# Patient Record
Sex: Male | Born: 1965 | Race: Black or African American | Hispanic: No | Marital: Married | Smoking: Never smoker
Health system: Southern US, Community
[De-identification: ages and names within clinical notes are randomized; demographics above are authoritative.]

## PROBLEM LIST (undated history)

## (undated) DIAGNOSIS — I10 Essential (primary) hypertension: Secondary | ICD-10-CM

## (undated) DIAGNOSIS — E119 Type 2 diabetes mellitus without complications: Secondary | ICD-10-CM

## (undated) DIAGNOSIS — E785 Hyperlipidemia, unspecified: Secondary | ICD-10-CM

## (undated) DIAGNOSIS — F5101 Primary insomnia: Secondary | ICD-10-CM

## (undated) DIAGNOSIS — Z8042 Family history of malignant neoplasm of prostate: Secondary | ICD-10-CM

## (undated) DIAGNOSIS — E349 Endocrine disorder, unspecified: Secondary | ICD-10-CM

## (undated) HISTORY — PX: CYST REMOVAL HAND: SHX6279

## (undated) HISTORY — DX: Essential (primary) hypertension: I10

## (undated) HISTORY — DX: Hyperlipidemia, unspecified: E78.5

## (undated) HISTORY — DX: Type 2 diabetes mellitus without complications: E11.9

## (undated) HISTORY — PX: OTHER SURGICAL HISTORY: SHX169

## (undated) HISTORY — DX: Endocrine disorder, unspecified: E34.9

---
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
## 2005-04-03 ENCOUNTER — Ambulatory Visit: Payer: Self-pay | Admitting: Family Medicine

## 2006-01-10 ENCOUNTER — Ambulatory Visit: Payer: Self-pay | Admitting: Family Medicine

## 2007-01-28 ENCOUNTER — Ambulatory Visit: Payer: Self-pay | Admitting: Family Medicine

## 2007-04-08 ENCOUNTER — Ambulatory Visit: Payer: Self-pay | Admitting: Family Medicine

## 2014-05-11 DIAGNOSIS — I10 Essential (primary) hypertension: Secondary | ICD-10-CM | POA: Insufficient documentation

## 2014-05-11 DIAGNOSIS — E291 Testicular hypofunction: Secondary | ICD-10-CM | POA: Insufficient documentation

## 2014-05-11 DIAGNOSIS — N529 Male erectile dysfunction, unspecified: Secondary | ICD-10-CM | POA: Insufficient documentation

## 2015-08-23 ENCOUNTER — Ambulatory Visit (INDEPENDENT_AMBULATORY_CARE_PROVIDER_SITE_OTHER): Payer: Worker's Compensation | Admitting: Pediatrics

## 2015-08-23 VITALS — BP 156/111 | HR 88 | Ht 70.0 in | Wt 225.8 lb

## 2015-08-23 DIAGNOSIS — S60940A Unspecified superficial injury of right index finger, initial encounter: Secondary | ICD-10-CM | POA: Diagnosis not present

## 2015-08-23 DIAGNOSIS — Z23 Encounter for immunization: Secondary | ICD-10-CM | POA: Diagnosis not present

## 2015-08-23 DIAGNOSIS — IMO0002 Reserved for concepts with insufficient information to code with codable children: Secondary | ICD-10-CM

## 2015-08-23 NOTE — Progress Notes (Signed)
    Subjective:    Patient ID: Julian Scott, male    DOB: 1966-05-15, 49 y.o.   MRN: [id number]  HPI: Julian CootsSpencer B Garrow is a 49 y.o. male presenting on 08/23/2015 for Laceration  He grabbed a doorknob at work and it broke in his hand. The metal portion cut his R index finger and lateral side of palm. This happened earlier today.  ROS: Per HPI unless specifically indicated above     Objective:    BP 156/111 mmHg  Pulse 88  Ht 5\' 10"  (1.778 m)  Wt 225 lb 12.8 oz (102.422 kg)  BMI 32.40 kg/m2  Wt Readings from Last 3 Encounters:  08/23/15 225 lb 12.8 oz (102.422 kg)    Gen: NAD, alert, cooperative with exam, NCAT EYES: EOMI, no scleral injection or icterus CV: WWP, radial pulses 2+ b/l.  Resp:  normal WOB Skin: sub 1mm deep apprx 8 mm in length laceration across palmar aspect of proximal 2nd finger with minimal bleeding. Another <71mm to 1mm deep laceration of apprx 7mm with very small skin flap curved in "C" shape on palm. Minimal bleeding. Skin is already well-approximated, lacerations not all the way through dermis.  Neuro: Alert and oriented, sensation intact b/l UE MSK: normal muscle bulk     Assessment & Plan:   Julian Scott was seen today for laceration. Skin is well-approximated, bleeding controlle,d no foreign bodies visible. Washed and cleaned lacerations with alcohol. Discussed with pt, stiches not needed at this time. Covered with antibiotic ointment. Wash with soap and water, keep clean, covered at work. Received tetanus shot as below. Will let us if worsens.  Diagnoses and all orders for this visit:  Laceration  Other orders -     Td : Tetanus/diphtheria >7yo Preservative  free    Follow up plan: Return if symptoms worsen or fail to improve.  Julian Krasarol Vincent, MD [facility] 08/23/2015, 4:24 PM

## 2015-08-23 NOTE — Patient Instructions (Addendum)
Bacitracin or similar triple antibioitc ointment  Wash with soap and water twice a day

## 2015-08-25 ENCOUNTER — Telehealth: Payer: Self-pay | Admitting: Pediatrics

## 2015-08-25 NOTE — Telephone Encounter (Signed)
Copied note and gave to Tonya/lc

## 2015-12-16 DIAGNOSIS — Z8042 Family history of malignant neoplasm of prostate: Secondary | ICD-10-CM

## 2015-12-16 HISTORY — DX: Family history of malignant neoplasm of prostate: Z80.42

## 2017-02-01 DIAGNOSIS — F5101 Primary insomnia: Secondary | ICD-10-CM | POA: Insufficient documentation

## 2017-02-01 DIAGNOSIS — E1169 Type 2 diabetes mellitus with other specified complication: Secondary | ICD-10-CM | POA: Insufficient documentation

## 2017-02-01 HISTORY — DX: Primary insomnia: F51.01

## 2019-05-05 ENCOUNTER — Ambulatory Visit: Payer: Commercial Managed Care - PPO | Admitting: Family Medicine

## 2019-05-05 ENCOUNTER — Encounter: Payer: Self-pay | Admitting: Family Medicine

## 2019-05-05 ENCOUNTER — Other Ambulatory Visit: Payer: Self-pay

## 2019-05-05 VITALS — BP 172/116 | HR 89 | Temp 96.8°F | Ht 70.0 in | Wt 217.0 lb

## 2019-05-05 DIAGNOSIS — E291 Testicular hypofunction: Secondary | ICD-10-CM

## 2019-05-05 DIAGNOSIS — E785 Hyperlipidemia, unspecified: Secondary | ICD-10-CM

## 2019-05-05 DIAGNOSIS — Z79899 Other long term (current) drug therapy: Secondary | ICD-10-CM

## 2019-05-05 DIAGNOSIS — N529 Male erectile dysfunction, unspecified: Secondary | ICD-10-CM

## 2019-05-05 DIAGNOSIS — F5101 Primary insomnia: Secondary | ICD-10-CM | POA: Diagnosis not present

## 2019-05-05 DIAGNOSIS — I10 Essential (primary) hypertension: Secondary | ICD-10-CM | POA: Diagnosis not present

## 2019-05-05 DIAGNOSIS — E1169 Type 2 diabetes mellitus with other specified complication: Secondary | ICD-10-CM

## 2019-05-05 DIAGNOSIS — Z7689 Persons encountering health services in other specified circumstances: Secondary | ICD-10-CM

## 2019-05-05 MED ORDER — ZOLPIDEM TARTRATE 10 MG PO TABS: 10.0000 mg | ORAL_TABLET | Freq: Every evening | ORAL | 5 refills | Status: DC | PRN

## 2019-05-05 MED ORDER — ATORVASTATIN CALCIUM 40 MG PO TABS: 40.0000 mg | ORAL_TABLET | Freq: Every day | ORAL | 1 refills | Status: DC

## 2019-05-05 MED ORDER — GLIPIZIDE ER 5 MG PO TB24: 5.0000 mg | ORAL_TABLET | Freq: Every day | ORAL | 1 refills | Status: DC

## 2019-05-05 MED ORDER — TESTOSTERONE CYPIONATE 200 MG/ML IM SOLN: 200.0000 mg | INTRAMUSCULAR | 2 refills | Status: DC

## 2019-05-05 MED ORDER — AMLODIPINE BESYLATE 10 MG PO TABS: 10.0000 mg | ORAL_TABLET | Freq: Every day | ORAL | 1 refills | Status: DC

## 2019-05-05 MED ORDER — TAMSULOSIN HCL 0.4 MG PO CAPS: 0.4000 mg | ORAL_CAPSULE | Freq: Every day | ORAL | 1 refills | Status: DC

## 2019-05-05 MED ORDER — LOSARTAN POTASSIUM-HCTZ 100-25 MG PO TABS: 1.0000 | ORAL_TABLET | Freq: Every day | ORAL | 1 refills | Status: DC

## 2019-05-05 MED ORDER — SILDENAFIL CITRATE 100 MG PO TABS: 50.0000 mg | ORAL_TABLET | Freq: Every day | ORAL | 0 refills | Status: DC | PRN

## 2019-05-05 NOTE — Progress Notes (Signed)
New Patient Office Visit  Assessment & Plan:  1. Uncontrolled hypertension - Patient has not taken BP medications in a few days. He admits he does not like taking medication and sometimes forgets. Education provided on the DASH diet. Encouraged to continue exercising and work up to 30 minutes 5 days a week.  - amLODipine (NORVASC) 10 MG tablet; Take 1 tablet (10 mg total) by mouth daily.  Dispense: 90 tablet; Refill: 1 - losartan-hydrochlorothiazide (HYZAAR) 100-25 MG tablet; Take 1 tablet by mouth daily.  Dispense: 90 tablet; Refill: 1  2. Erectile dysfunction, unspecified erectile dysfunction type - sildenafil (VIAGRA) 100 MG tablet; Take 0.5-1 tablets (50-100 mg total) by mouth daily as needed for erectile dysfunction.  Dispense: 30 tablet; Refill: 0  3. Primary insomnia - Controled substance agreement signed today for Ambien 10 mg QHS PRN. Education provided on insomnia.  - ToxASSURE Select 13 (MW), Urine - zolpidem (AMBIEN) 10 MG tablet; Take 1 tablet (10 mg total) by mouth at bedtime as needed. for sleep  Dispense: 30 tablet; Refill: 5  4. Controlled substance agreement signed - Controled substance agreement signed today for Ambien 10 mg QHS PRN - ToxASSURE Select 13 (MW), Urine  5. DM type 2 with diabetic dyslipidemia (HCC) - Last A1c 7.2 with no medication changes made by previous PCP. Patient does not like to take medication. He has recently started exercising; encouraged him to increase to 30 minutes 5 days a week. Education provided on a diabetic diet. If A1c still > 7.0 at his next visit we will increase glipizide to 10 mg QD from 5 mg QD.   6. Hypogonadism in male - Managed with injections Q14 days by his wife.  - testosterone cypionate (DEPOTESTOSTERONE CYPIONATE) 200 MG/ML injection; Inject 1 mL (200 mg total) into the muscle every 14 (fourteen) days.  Dispense: 2 mL; Refill: 2  7. Encounter to establish care   Follow-up: Return in about 2 months (around 07/06/2019)  for DM, ED.   Deliah BostonBritney , MSN, APRN, FNP-C [facility]  Subjective:  Patient ID: Julian Scott, male    DOB: 1966/01/13  Age: 53 y.o. MRN: [id number]  CC:  Chief Complaint  Patient presents with  . Establish Care    HPI Julian CootsSpencer B Scott presents to establish care. He is transferring care from Dr.  CopaNyland's office as he has retired and the office has closed.   Hypogonadism: Patient's wife gives him the testosterone injections.  Hypertension: He has not had his BP medication this weekend but did take it this morning about an hour and a half ago. He started walking this weekend for exercise. He does not check his BP at home. Does limit salt.   Insomnia: Patient takes Ambien nightly to help with sleep.    Review of Systems  Constitutional: Negative for chills, fever, malaise/fatigue and weight loss.  HENT: Negative for congestion, ear discharge, ear pain, nosebleeds, sinus pain, sore throat and tinnitus.   Eyes: Negative for blurred vision, double vision, pain, discharge and redness.  Respiratory: Negative for cough, shortness of breath and wheezing.   Cardiovascular: Negative for chest pain, palpitations and leg swelling.  Gastrointestinal: Negative for abdominal pain, constipation, diarrhea, heartburn, nausea and vomiting.  Genitourinary: Negative for dysuria, frequency and urgency.  Musculoskeletal: Negative for myalgias.  Skin: Negative for rash.  Neurological: Negative for dizziness, seizures, weakness and headaches.  Psychiatric/Behavioral: Negative for depression, substance abuse and suicidal ideas. The patient has insomnia. The patient is not nervous/anxious.  Current Outpatient Medications:  .  amLODipine (NORVASC) 10 MG tablet, TAKE 1 TABLET BY MOUTH DAILY, Disp: , Rfl:  .  atorvastatin (LIPITOR) 40 MG tablet, Take 40 mg by mouth daily. , Disp: , Rfl:  .  glipiZIDE (GLUCOTROL XL) 5 MG 24 hr tablet, Take 5 mg by mouth daily with  breakfast. , Disp: , Rfl:  .  losartan-hydrochlorothiazide (HYZAAR) 100-25 MG tablet, Take 1 tablet by mouth daily. , Disp: , Rfl:  .  tamsulosin (FLOMAX) 0.4 MG CAPS capsule, TAKE ONE CAPSULE (0.4 MG DOSE) BY MOUTH AT BEDTIME. FOR PROSTATE ENLARGEMENT, Disp: , Rfl:  .  testosterone cypionate (DEPOTESTOSTERONE CYPIONATE) 200 MG/ML injection, INJECT 1ML INTRAMUSCULARLY EVERY 14 DAYS, Disp: , Rfl:  .  zolpidem (AMBIEN) 10 MG tablet, Take 10 mg by mouth at bedtime as needed. for sleep, Disp: , Rfl:  .  sildenafil (VIAGRA) 100 MG tablet, Take 0.5-1 tablets (50-100 mg total) by mouth daily as needed for erectile dysfunction., Disp: 30 tablet, Rfl: 0  No Known Allergies  Past Medical History:  Diagnosis Date  . Diabetes mellitus ([place])   . Hyperlipidemia   . Hypertension   . Testosterone deficiency     Past Surgical History:  Procedure Laterality Date  . Cyst Removal Back    . CYST REMOVAL HAND      Family History  Problem Relation Age of Onset  . Hypertension Mother   . Diabetes Mother   . Parkinson's disease Father   . Dementia Father   . Hypertension Brother   . Diabetes Maternal Grandmother   . Heart attack Maternal Grandfather   . Dementia Paternal Grandmother   . Lung disease Paternal Grandfather     Social History   Socioeconomic History  . Marital status: Married    Spouse name: Not on file  . Number of children: Not on file  . Years of education: Not on file  . Highest education level: Not on file  Occupational History  . Not on file  Social Needs  . Financial resource strain: Not on file  . Food insecurity    Worry: Not on file    Inability: Not on file  . Transportation needs    Medical: Not on file    Non-medical: Not on file  Tobacco Use  . Smoking status: Never Smoker  . Smokeless tobacco: Never Used  Substance and Sexual Activity  . Alcohol use: Not Currently    Comment: Every blue moon - socially  . Drug use: Never  . Sexual activity: Not on file   Lifestyle  . Physical activity    Days per week: Not on file    Minutes per session: Not on file  . Stress: Not on file  Relationships  . Social Herbalist on phone: Not on file    Gets together: Not on file    Attends religious service: Not on file    Active member of club or organization: Not on file    Attends meetings of clubs or organizations: Not on file    Relationship status: Not on file  . Intimate partner violence    Fear of current or ex partner: Not on file    Emotionally abused: Not on file    Physically abused: Not on file    Forced sexual activity: Not on file  Other Topics Concern  . Not on file  Social History Narrative  . Not on file    Objective:   Today's  Vitals: BP (!) 172/116 Comment: manual  Pulse 89   Temp (!) 96.8 F (36 C) (Oral)   Ht 5\' 10"  (1.778 m)   Wt 217 lb (98.4 kg)   BMI 31.14 kg/m   Physical Exam Vitals signs reviewed.  Constitutional:      General: He is not in acute distress.    Appearance: Normal appearance. He is overweight. He is not ill-appearing, toxic-appearing or diaphoretic.  HENT:     Head: Normocephalic and atraumatic.  Eyes:     General: No scleral icterus.       Right eye: No discharge.        Left eye: No discharge.     Conjunctiva/sclera: Conjunctivae normal.  Neck:     Musculoskeletal: Normal range of motion.  Cardiovascular:     Rate and Rhythm: Normal rate and regular rhythm.     Heart sounds: Normal heart sounds. No murmur. No friction rub. No gallop.   Pulmonary:     Effort: Pulmonary effort is normal. No respiratory distress.     Breath sounds: Normal breath sounds. No stridor. No wheezing, rhonchi or rales.  Musculoskeletal: Normal range of motion.  Skin:    General: Skin is warm and dry.  Neurological:     Mental Status: He is alert and oriented to person, place, and time. Mental status is at baseline.  Psychiatric:        Mood and Affect: Mood normal.        Behavior: Behavior normal.         Thought Content: Thought content normal.        Judgment: Judgment normal.

## 2019-05-05 NOTE — Patient Instructions (Addendum)
DASH Eating Plan °DASH stands for "Dietary Approaches to Stop Hypertension." The DASH eating plan is a healthy eating plan that has been shown to reduce high blood pressure (hypertension). It may also reduce your risk for type 2 diabetes, heart disease, and stroke. The DASH eating plan may also help with weight loss. °What are tips for following this plan? ° °General guidelines °· Avoid eating more than 2,300 mg (milligrams) of salt (sodium) a day. If you have hypertension, you may need to reduce your sodium intake to 1,500 mg a day. °· Limit alcohol intake to no more than 1 drink a day for nonpregnant women and 2 drinks a day for men. One drink equals 12 oz of beer, 5 oz of wine, or 1½ oz of hard liquor. °· Work with your health care provider to maintain a healthy body weight or to lose weight. Ask what an ideal weight is for you. °· Get at least 30 minutes of exercise that causes your heart to beat faster (aerobic exercise) most days of the week. Activities may include walking, swimming, or biking. °· Work with your health care provider or diet and nutrition specialist (dietitian) to adjust your eating plan to your individual calorie needs. °Reading food labels ° °· Check food labels for the amount of sodium per serving. Choose foods with less than 5 percent of the Daily Value of sodium. Generally, foods with less than 300 mg of sodium per serving fit into this eating plan. °· To find whole grains, look for the word "whole" as the first word in the ingredient list. °Shopping °· Buy products labeled as "low-sodium" or "no salt added." °· Buy fresh foods. Avoid canned foods and premade or frozen meals. °Cooking °· Avoid adding salt when cooking. Use salt-free seasonings or herbs instead of table salt or sea salt. Check with your health care provider or pharmacist before using salt substitutes. °· Do not fry foods. Cook foods using healthy methods such as baking, boiling, grilling, and broiling instead. °· Cook with  heart-healthy oils, such as olive, canola, soybean, or sunflower oil. °Meal planning °· Eat a balanced diet that includes: °? 5 or more servings of fruits and vegetables each day. At each meal, try to fill half of your plate with fruits and vegetables. °? Up to 6-8 servings of whole grains each day. °? Less than 6 oz of lean meat, poultry, or fish each day. A 3-oz serving of meat is about the same size as a deck of cards. One egg equals 1 oz. °? 2 servings of low-fat dairy each day. °? A serving of nuts, seeds, or beans 5 times each week. °? Heart-healthy fats. Healthy fats called Omega-3 fatty acids are found in foods such as flaxseeds and coldwater fish, like sardines, salmon, and mackerel. °· Limit how much you eat of the following: °? Canned or prepackaged foods. °? Food that is high in trans fat, such as fried foods. °? Food that is high in saturated fat, such as fatty meat. °? Sweets, desserts, sugary drinks, and other foods with added sugar. °? Full-fat dairy products. °· Do not salt foods before eating. °· Try to eat at least 2 vegetarian meals each week. °· Eat more home-cooked food and less restaurant, buffet, and fast food. °· When eating at a restaurant, ask that your food be prepared with less salt or no salt, if possible. °What foods are recommended? °The items listed may not be a complete list. Talk with your dietitian about   what dietary choices are best for you. °Grains °Whole-grain or whole-wheat bread. Whole-grain or whole-wheat pasta. Brown rice. Oatmeal. Quinoa. Bulgur. Whole-grain and low-sodium cereals. Pita bread. Low-fat, low-sodium crackers. Whole-wheat flour tortillas. °Vegetables °Fresh or frozen vegetables (raw, steamed, roasted, or grilled). Low-sodium or reduced-sodium tomato and vegetable juice. Low-sodium or reduced-sodium tomato sauce and tomato paste. Low-sodium or reduced-sodium canned vegetables. °Fruits °All fresh, dried, or frozen fruit. Canned fruit in natural juice (without  added sugar). °Meat and other protein foods °Skinless chicken or turkey. Ground chicken or turkey. Pork with fat trimmed off. Fish and seafood. Egg whites. Dried beans, peas, or lentils. Unsalted nuts, nut butters, and seeds. Unsalted canned beans. Lean cuts of beef with fat trimmed off. Low-sodium, lean deli meat. °Dairy °Low-fat (1%) or fat-free (skim) milk. Fat-free, low-fat, or reduced-fat cheeses. Nonfat, low-sodium ricotta or cottage cheese. Low-fat or nonfat yogurt. Low-fat, low-sodium cheese. °Fats and oils °Soft margarine without trans fats. Vegetable oil. Low-fat, reduced-fat, or light mayonnaise and salad dressings (reduced-sodium). Canola, safflower, olive, soybean, and sunflower oils. Avocado. °Seasoning and other foods °Herbs. Spices. Seasoning mixes without salt. Unsalted popcorn and pretzels. Fat-free sweets. °What foods are not recommended? °The items listed may not be a complete list. Talk with your dietitian about what dietary choices are best for you. °Grains °Baked goods made with fat, such as croissants, muffins, or some breads. Dry pasta or rice meal packs. °Vegetables °Creamed or fried vegetables. Vegetables in a cheese sauce. Regular canned vegetables (not low-sodium or reduced-sodium). Regular canned tomato sauce and paste (not low-sodium or reduced-sodium). Regular tomato and vegetable juice (not low-sodium or reduced-sodium). Pickles. Olives. °Fruits °Canned fruit in a light or heavy syrup. Fried fruit. Fruit in cream or butter sauce. °Meat and other protein foods °Fatty cuts of meat. Ribs. Fried meat. Bacon. Sausage. Bologna and other processed lunch meats. Salami. Fatback. Hotdogs. Bratwurst. Salted nuts and seeds. Canned beans with added salt. Canned or smoked fish. Whole eggs or egg yolks. Chicken or turkey with skin. °Dairy °Whole or 2% milk, cream, and half-and-half. Whole or full-fat cream cheese. Whole-fat or sweetened yogurt. Full-fat cheese. Nondairy creamers. Whipped toppings.  Processed cheese and cheese spreads. °Fats and oils °Butter. Stick margarine. Lard. Shortening. Ghee. Bacon fat. Tropical oils, such as coconut, palm kernel, or palm oil. °Seasoning and other foods °Salted popcorn and pretzels. Onion salt, garlic salt, seasoned salt, table salt, and sea salt. Worcestershire sauce. Tartar sauce. Barbecue sauce. Teriyaki sauce. Soy sauce, including reduced-sodium. Steak sauce. Canned and packaged gravies. Fish sauce. Oyster sauce. Cocktail sauce. Horseradish that you find on the shelf. Ketchup. Mustard. Meat flavorings and tenderizers. Bouillon cubes. Hot sauce and Tabasco sauce. Premade or packaged marinades. Premade or packaged taco seasonings. Relishes. Regular salad dressings. °Where to find more information: °· National Heart, Lung, and Blood Institute: www.nhlbi.nih.gov °· American Heart Association: www.heart.org °Summary °· The DASH eating plan is a healthy eating plan that has been shown to reduce high blood pressure (hypertension). It may also reduce your risk for type 2 diabetes, heart disease, and stroke. °· With the DASH eating plan, you should limit salt (sodium) intake to 2,300 mg a day. If you have hypertension, you may need to reduce your sodium intake to 1,500 mg a day. °· When on the DASH eating plan, aim to eat more fresh fruits and vegetables, whole grains, lean proteins, low-fat dairy, and heart-healthy fats. °· Work with your health care provider or diet and nutrition specialist (dietitian) to adjust your eating plan to your   individual calorie needs. This information is not intended to replace advice given to you by your health care provider. Make sure you discuss any questions you have with your health care provider. Document Released: 09/21/2011 Document Revised: 09/14/2017 Document Reviewed: 09/25/2016 Elsevier Patient Education  2020 [employer]   Diabetes Mellitus and Nutrition, Adult When you have diabetes (diabetes mellitus), it is very  important to have healthy eating habits because your blood sugar (glucose) levels are greatly affected by what you eat and drink. Eating healthy foods in the appropriate amounts, at about the same times every day, can help you:  Control your blood glucose.  Lower your risk of heart disease.  Improve your blood pressure.  Reach or maintain a healthy weight. Every person with diabetes is different, and each person has different needs for a meal plan. Your health care provider may recommend that you work with a diet and nutrition specialist (dietitian) to make a meal plan that is best for you. Your meal plan may vary depending on factors such as:  The calories you need.  The medicines you take.  Your weight.  Your blood glucose, blood pressure, and cholesterol levels.  Your activity level.  Other health conditions you have, such as heart or kidney disease. How do carbohydrates affect me? Carbohydrates, also called carbs, affect your blood glucose level more than any other type of food. Eating carbs naturally raises the amount of glucose in your blood. Carb counting is a method for keeping track of how many carbs you eat. Counting carbs is important to keep your blood glucose at a healthy level, especially if you use insulin or take certain oral diabetes medicines. It is important to know how many carbs you can safely have in each meal. This is different for every person. Your dietitian can help you calculate how many carbs you should have at each meal and for each snack. Foods that contain carbs include:  Bread, cereal, rice, pasta, and crackers.  Potatoes and corn.  Peas, beans, and lentils.  Milk and yogurt.  Fruit and juice.  Desserts, such as cakes, cookies, ice cream, and candy. How does alcohol affect me? Alcohol can cause a sudden decrease in blood glucose (hypoglycemia), especially if you use insulin or take certain oral diabetes medicines. Hypoglycemia can be a  life-threatening condition. Symptoms of hypoglycemia (sleepiness, dizziness, and confusion) are similar to symptoms of having too much alcohol. If your health care provider says that alcohol is safe for you, follow these guidelines:  Limit alcohol intake to no more than 1 drink per day for nonpregnant women and 2 drinks per day for men. One drink equals 12 oz of beer, 5 oz of wine, or 1 oz of hard liquor.  Do not drink on an empty stomach.  Keep yourself hydrated with water, diet soda, or unsweetened iced tea.  Keep in mind that regular soda, juice, and other mixers may contain a lot of sugar and must be counted as carbs. What are tips for following this plan?  Reading food labels  Start by checking the serving size on the "Nutrition Facts" label of packaged foods and drinks. The amount of calories, carbs, fats, and other nutrients listed on the label is based on one serving of the item. Many items contain more than one serving per package.  Check the total grams (g) of carbs in one serving. You can calculate the number of servings of carbs in one serving by dividing the total carbs by 15.  For example, if a food has 30 g of total carbs, it would be equal to 2 servings of carbs.  Check the number of grams (g) of saturated and trans fats in one serving. Choose foods that have low or no amount of these fats.  Check the number of milligrams (mg) of salt (sodium) in one serving. Most people should limit total sodium intake to less than 2,300 mg per day.  Always check the nutrition information of foods labeled as "low-fat" or "nonfat". These foods may be higher in added sugar or refined carbs and should be avoided.  Talk to your dietitian to identify your daily goals for nutrients listed on the label. Shopping  Avoid buying canned, premade, or processed foods. These foods tend to be high in fat, sodium, and added sugar.  Shop around the outside edge of the grocery store. This includes fresh  fruits and vegetables, bulk grains, fresh meats, and fresh dairy. Cooking  Use low-heat cooking methods, such as baking, instead of high-heat cooking methods like deep frying.  Cook using healthy oils, such as olive, canola, or sunflower oil.  Avoid cooking with butter, cream, or high-fat meats. Meal planning  Eat meals and snacks regularly, preferably at the same times every day. Avoid going long periods of time without eating.  Eat foods high in fiber, such as fresh fruits, vegetables, beans, and whole grains. Talk to your dietitian about how many servings of carbs you can eat at each meal.  Eat 4-6 ounces (oz) of lean protein each day, such as lean meat, chicken, fish, eggs, or tofu. One oz of lean protein is equal to: ? 1 oz of meat, chicken, or fish. ? 1 egg. ?  cup of tofu.  Eat some foods each day that contain healthy fats, such as avocado, nuts, seeds, and fish. Lifestyle  Check your blood glucose regularly.  Exercise regularly as told by your health care provider. This may include: ? 150 minutes of moderate-intensity or vigorous-intensity exercise each week. This could be brisk walking, biking, or water aerobics. ? Stretching and doing strength exercises, such as yoga or weightlifting, at least 2 times a week.  Take medicines as told by your health care provider.  Do not use any products that contain nicotine or tobacco, such as cigarettes and e-cigarettes. If you need help quitting, ask your health care provider.  Work with a Veterinary surgeoncounselor or diabetes educator to identify strategies to manage stress and any emotional and social challenges. Questions to ask a health care provider  Do I need to meet with a diabetes educator?  Do I need to meet with a dietitian?  What number can I call if I have questions?  When are the best times to check my blood glucose? Where to find more information:  American Diabetes Association: diabetes.org  Academy of Nutrition and  Dietetics: www.eatright.[employer]  National Institute of Diabetes and Digestive and Kidney Diseases (NIH): Car[employer]lippers.tnwww.niddk.nih.gov Summary  A healthy meal plan will help you control your blood glucose and maintain a healthy lifestyle.  Working with a diet and nutrition specialist (dietitian) can help you make a meal plan that is best for you.  Keep in mind that carbohydrates (carbs) and alcohol have immediate effects on your blood glucose levels. It is important to count carbs and to use alcohol carefully. This information is not intended to replace advice given to you by your health care provider. Make sure you discuss any questions you have with your health care provider. Document  Released: 06/29/2005 Document Revised: 09/14/2017 Document Reviewed: 11/06/2016 Elsevier Patient Education  2020 Elsevier Inc.   Insomnia Insomnia is a sleep disorder that makes it difficult to fall asleep or stay asleep. Insomnia can cause fatigue, low energy, difficulty concentrating, mood swings, and poor performance at work or school. There are three different ways to classify insomnia:  Difficulty falling asleep.  Difficulty staying asleep.  Waking up too early in the morning. Any type of insomnia can be long-term (chronic) or short-term (acute). Both are common. Short-term insomnia usually lasts for three months or less. Chronic insomnia occurs at least three times a week for longer than three months. What are the causes? Insomnia may be caused by another condition, situation, or substance, such as:  Anxiety.  Certain medicines.  Gastroesophageal reflux disease (GERD) or other gastrointestinal conditions.  Asthma or other breathing conditions.  Restless legs syndrome, sleep apnea, or other sleep disorders.  Chronic pain.  Menopause.  Stroke.  Abuse of alcohol, tobacco, or illegal drugs.  Mental health conditions, such as depression.  Caffeine.  Neurological disorders, such as Alzheimer's  disease.  An overactive thyroid (hyperthyroidism). Sometimes, the cause of insomnia may not be known. What increases the risk? Risk factors for insomnia include:  Gender. Women are affected more often than men.  Age. Insomnia is more common as you get older.  Stress.  Lack of exercise.  Irregular work schedule or working night shifts.  Traveling between different time zones.  Certain medical and mental health conditions. What are the signs or symptoms? If you have insomnia, the main symptom is having trouble falling asleep or having trouble staying asleep. This may lead to other symptoms, such as:  Feeling fatigued or having low energy.  Feeling nervous about going to sleep.  Not feeling rested in the morning.  Having trouble concentrating.  Feeling irritable, anxious, or depressed. How is this diagnosed? This condition may be diagnosed based on:  Your symptoms and medical history. Your health care provider may ask about: ? Your sleep habits. ? Any medical conditions you have. ? Your mental health.  A physical exam. How is this treated? Treatment for insomnia depends on the cause. Treatment may focus on treating an underlying condition that is causing insomnia. Treatment may also include:  Medicines to help you sleep.  Counseling or therapy.  Lifestyle adjustments to help you sleep better. Follow these instructions at home: Eating and drinking   Limit or avoid alcohol, caffeinated beverages, and cigarettes, especially close to bedtime. These can disrupt your sleep.  Do not eat a large meal or eat spicy foods right before bedtime. This can lead to digestive discomfort that can make it hard for you to sleep. Sleep habits   Keep a sleep diary to help you and your health care provider figure out what could be causing your insomnia. Write down: ? When you sleep. ? When you wake up during the night. ? How well you sleep. ? How rested you feel the next  day. ? Any side effects of medicines you are taking. ? What you eat and drink.  Make your bedroom a dark, comfortable place where it is easy to fall asleep. ? Put up shades or blackout curtains to block light from outside. ? Use a white noise machine to block noise. ? Keep the temperature cool.  Limit screen use before bedtime. This includes: ? Watching TV. ? Using your smartphone, tablet, or computer.  Stick to a routine that includes going to bed and  waking up at the same times every day and night. This can help you fall asleep faster. Consider making a quiet activity, such as reading, part of your nighttime routine.  Try to avoid taking naps during the day so that you sleep better at night.  Get out of bed if you are still awake after 15 minutes of trying to sleep. Keep the lights down, but try reading or doing a quiet activity. When you feel sleepy, go back to bed. General instructions  Take over-the-counter and prescription medicines only as told by your health care provider.  Exercise regularly, as told by your health care provider. Avoid exercise starting several hours before bedtime.  Use relaxation techniques to manage stress. Ask your health care provider to suggest some techniques that may work well for you. These may include: ? Breathing exercises. ? Routines to release muscle tension. ? Visualizing peaceful scenes.  Make sure that you drive carefully. Avoid driving if you feel very sleepy.  Keep all follow-up visits as told by your health care provider. This is important. Contact a health care provider if:  You are tired throughout the day.  You have trouble in your daily routine due to sleepiness.  You continue to have sleep problems, or your sleep problems get worse. Get help right away if:  You have serious thoughts about hurting yourself or someone else. If you ever feel like you may hurt yourself or others, or have thoughts about taking your own life, get  help right away. You can go to your nearest emergency department or call:  Your local emergency services (911 in the [place]  A suicide crisis helpline, such as the National Suicide Prevention Lifeline at 385-057-02901-979-504-9034. This is open 24 hours a day. Summary  Insomnia is a sleep disorder that makes it difficult to fall asleep or stay asleep.  Insomnia can be long-term (chronic) or short-term (acute).  Treatment for insomnia depends on the cause. Treatment may focus on treating an underlying condition that is causing insomnia.  Keep a sleep diary to help you and your health care provider figure out what could be causing your insomnia. This information is not intended to replace advice given to you by your health care provider. Make sure you discuss any questions you have with your health care provider. Document Released: 09/29/2000 Document Revised: 09/14/2017 Document Reviewed: 07/12/2017 Elsevier Patient Education  2020 [employer]   Colorectal Cancer  Colorectal cancer is an abnormal growth of cells and tissue (tumor) in the colon or rectum, which are parts of the large intestine. The cancer can spread (metastasize) to other parts of the body. What are the causes? Most cases of colorectal cancer start as abnormal growths called polyps on the inner wall of the colon or rectum. Other times, abnormal changes to genes (genetic mutations) can cause cells to form cancer. What increases the risk? You are more likely to develop this condition if:  You are older than age 51.  You have multiple polyps in the colon or rectum.  You have diabetes.  You are African American.  You have a family history of Lynch syndrome.  You have had cancer before.  You have certain hereditary conditions, such as: ? Familial adenomatous polyposis. ? Turcot syndrome. ? Peutz-Jeghers syndrome.  You eat a diet that is high in fat (especially animal fat) and low in fiber, fruits, and vegetables.  You  have an inactive (sedentary) lifestyle.  You have an inflammatory bowel disease or Crohn's disease.  You smoke.  You drink alcohol excessively. What are the signs or symptoms? Early colorectal cancer often does not cause symptoms. As the cancer grows, symptoms may include:  Changes in bowel habits.  Feeling like the bowel does not empty completely after a bowel movement.  Stools that are narrower than usual.  Blood in the stool.  Diarrhea.  Constipation.  Anemia.  Discomfort, pain, bloating, fullness, or cramps in the abdomen.  Frequent gas pain.  Unexplained weight loss.  Constant fatigue.  Nausea and vomiting. How is this diagnosed? This condition may be diagnosed with:  A medical history.  A physical exam.  Tests. These may include: ? A digital rectal exam. ? A stool test called a fecal occult blood test. ? Blood tests. ? A test in which a tissue sample is taken from the colon or rectum and examined under a microscope (biopsy). ? Imaging tests, such as:  X-rays.  A barium enema.  CT scans.  MRIs.  A sigmoidoscopy. This test is done to view the inside of the rectum.  A colonoscopy. This test is done to view the inside of the colon. During this test, small polyps can be removed or biopsies may be taken.  An endorectal ultrasound. This test checks how deep a tumor in the rectum has grown and whether the cancer has spread to lymph nodes or other nearby tissues. Your health care provider may order additional tests to find out whether the cancer has spread to other parts of the body (what stage it is). The stages of cancer include:  Stage 0. At this stage, the cancer is found only in the innermost lining of the colon or rectum.  Stage I. At this stage, the cancer has grown into the inner wall of the colon or rectum.  Stage II. At this stage, the cancer has gone more deeply into the wall of the colon or rectum or through the wall. It may have invaded  nearby tissue.  Stage III. At this stage, the cancer has spread to nearby lymph nodes.  Stage IV. At this stage, the cancer has spread to other parts of the body, such as the liver or lungs. How is this treated? Treatment for this condition depends on the type and stage of the cancer. Treatment may include:  Surgery. In the early stages of the cancer, surgery may be done to remove polyps or small tumors from the colon. In later stages, surgery may be done to remove part of the colon.  Chemotherapy. This treatment uses medicines to kill cancer cells.  Targeted therapy. This treatment targets specific gene mutations or proteins that the cancer expresses in order to kill tumor cells.  Radiation therapy. This treatment uses radiation to kill cancer cells or shrink tumors.  Radiofrequency ablation. This treatment uses radio waves to destroy the tumors that may have spread to other areas of the body, such as the liver. Follow these instructions at home:  Take over-the-counter and prescription medicines only as told by your health care provider.  Try to eat regular, healthy meals. Some of your treatments might affect your appetite. If you are having problems eating or with your appetite, ask to meet with a food and nutrition specialist (dietitian).  Consider joining a support group. This may help you learn about your diagnosis and cope with the stress of having colorectal cancer.  If you are admitted to the hospital, inform your cancer care team.  Keep all follow-up visits as told by your health  care provider. This is important. How is this prevented?  Colorectal cancer can be prevented with screening tests that find polyps so they can be removed before they develop into cancer.  All adults should have screening for colorectal cancer starting at age 48 and continuing until age 49. Your health care provider may recommend screening at age 28. People at increased risk should start screening at  an earlier age. Where to find more information  American Cancer Society: https://www.cancer.org  [employer] (NCI): https://www.cancer.gov Contact a health care provider if:  Your diarrhea or constipation does not go away.  You have blood in your stool.  Your bowel habits change.  You have increased pain in your abdomen.  You notice new fatigue or weakness.  You lose weight. Get help right away if:  You have increased bleeding from your rectum.  You have any uncontrollable or severe abdomen (abdominal) symptoms. Summary  Colorectal cancer is an abnormal growth of cells and tissue (tumor) in the colon or rectum.  Common risk factors for this condition include having a relative with colon cancer, being older in age, having an inflammatory bowel disease, and being African American.  This condition may be diagnosed with tests, such as a colonoscopy and biopsy.  Treatment depends on the type and stage of the cancer. Commonly, treatment includes surgery to remove the tumor along with chemotherapy or targeted therapy.  Keep all follow-up visits as told by your health care provider. This is important. This information is not intended to replace advice given to you by your health care provider. Make sure you discuss any questions you have with your health care provider. Document Released: 10/02/2005 Document Revised: 11/22/2017 Document Reviewed: 11/03/2016 Elsevier Patient Education  2020 [employer].

## 2019-05-06 ENCOUNTER — Telehealth: Payer: Self-pay | Admitting: Family Medicine

## 2019-05-06 NOTE — Telephone Encounter (Signed)
Good Rx card left up front for pt

## 2019-05-07 LAB — TOXASSURE SELECT 13 (MW), URINE

## 2019-07-07 ENCOUNTER — Other Ambulatory Visit: Payer: Self-pay

## 2019-07-07 NOTE — Progress Notes (Deleted)
Assessment & Plan:  ***  No follow-ups on file.  [name], MSN, APRN, FNP-C [facility]  Subjective:    Patient ID: Julian Scott, male    DOB: 04-01-1966, 53 y.o.   MRN: [id number]  Patient Care Team: Loman Brooklyn, FNP as PCP - General (Family Medicine)   Chief Complaint: No chief complaint on file.   HPI: Julian Scott is a 53 y.o. male presenting on 07/08/2019 for No chief complaint on file.  Diabetes: Patient presents for follow up of diabetes. Current symptoms include: {dm sx:14075}. Symptoms have {symptom progression:19445}. Known diabetic complications: none. Cardiovascular risk factors: diabetes mellitus, dyslipidemia, hypertension, male gender and obesity (BMI >= 30 kg/m2). Current diabetic medications include oral agent (monotherapy): glipizide GITS (Glucotrol-XL). Eye exam current (within one year): {yes/no/unknown:74}. Weight trend: stable. Prior visit with dietician: {yes***/no:17258}. Current diet: {diet habits:16563}. Current exercise: {exercise types:16438}. Current monitoring regimen: {diabetes monitoring:1217}. Home blood sugar records: {diabetes glucometry results:16657} {dm home sugars:14018}. Any episodes of hypoglycemia? {yes***/no:17258}. Is He on ACE inhibitor or angiotensin II receptor blocker? Yes, losartan + HCTZ (Hyzaar).  Hypertension: Patient here for follow-up of elevated blood pressure. He {is/is not:9024} exercising and {is/is not:9024} adherent to low salt diet.  Blood pressure {is/is not:9024} well controlled at home. Cardiac symptoms {Symptoms; cardiac:12860}. Patient denies {Symptoms; cardiac:12860}.  Cardiovascular risk factors: diabetes mellitus, dyslipidemia, hypertension, male gender and obesity (BMI >= 30 kg/m2). Use of agents associated with hypertension: none. History of target organ damage: none.  Erectile Dysfunction: Patient complains of erectile dysfunction.  Onset of dysfunction was {0-10:33138}  {units:11} ago and was {onset:31634} in onset.  Patient states the nature of difficulty is {ED difficulty:16474}. Full erections occur {erections:16472}. Partial erections occur {erections:16472}. Libido {is/not:9024} affected. Risk factors for ED include cardiovascular disease, diabetes mellitus and antihypertensive medications, losartan/HCTZ and amlodipine. Patient's expectations as to sexual function ***.  Patient's description of relationship w/partner ***.  Previous treatment of ED includes ***.   New complaints: ***  Social history:  Relevant past medical, surgical, family and social history reviewed and updated as indicated. Interim medical history since our last visit reviewed.  Allergies and medications reviewed and updated.  DATA REVIEWED: CHART IN EPIC  ROS: Negative unless specifically indicated above in HPI.    Current Outpatient Medications:  .  amLODipine (NORVASC) 10 MG tablet, Take 1 tablet (10 mg total) by mouth daily., Disp: 90 tablet, Rfl: 1 .  atorvastatin (LIPITOR) 40 MG tablet, Take 1 tablet (40 mg total) by mouth daily at 6 PM., Disp: 90 tablet, Rfl: 1 .  glipiZIDE (GLUCOTROL XL) 5 MG 24 hr tablet, Take 1 tablet (5 mg total) by mouth daily with breakfast., Disp: 90 tablet, Rfl: 1 .  losartan-hydrochlorothiazide (HYZAAR) 100-25 MG tablet, Take 1 tablet by mouth daily., Disp: 90 tablet, Rfl: 1 .  sildenafil (VIAGRA) 100 MG tablet, Take 0.5-1 tablets (50-100 mg total) by mouth daily as needed for erectile dysfunction., Disp: 30 tablet, Rfl: 0 .  tamsulosin (FLOMAX) 0.4 MG CAPS capsule, Take 1 capsule (0.4 mg total) by mouth daily after supper., Disp: 90 capsule, Rfl: 1 .  [START ON 07/28/2019] testosterone cypionate (DEPOTESTOSTERONE CYPIONATE) 200 MG/ML injection, Inject 1 mL (200 mg total) into the muscle every 14 (fourteen) days., Disp: 2 mL, Rfl: 2 .  zolpidem (AMBIEN) 10 MG tablet, Take 1 tablet (10 mg total) by mouth at bedtime as needed. for sleep, Disp: 30 tablet,  Rfl: 5   No Known Allergies Past Medical  History:  Diagnosis Date  . Diabetes mellitus ([place])   . Family history of prostate cancer in father 12/16/2015  . Hyperlipidemia   . Hypertension   . Primary insomnia 02/01/2017  . Testosterone deficiency     Past Surgical History:  Procedure Laterality Date  . Cyst Removal Back    . CYST REMOVAL HAND      Social History   Socioeconomic History  . Marital status: Married    Spouse name: Not on file  . Number of children: Not on file  . Years of education: Not on file  . Highest education level: Not on file  Occupational History  . Not on file  Social Needs  . Financial resource strain: Not on file  . Food insecurity    Worry: Not on file    Inability: Not on file  . Transportation needs    Medical: Not on file    Non-medical: Not on file  Tobacco Use  . Smoking status: Never Smoker  . Smokeless tobacco: Never Used  Substance and Sexual Activity  . Alcohol use: Not Currently    Comment: Every blue moon - socially  . Drug use: Never  . Sexual activity: Not on file  Lifestyle  . Physical activity    Days per week: Not on file    Minutes per session: Not on file  . Stress: Not on file  Relationships  . Social Herbalist on phone: Not on file    Gets together: Not on file    Attends religious service: Not on file    Active member of club or organization: Not on file    Attends meetings of clubs or organizations: Not on file    Relationship status: Not on file  . Intimate partner violence    Fear of current or ex partner: Not on file    Emotionally abused: Not on file    Physically abused: Not on file    Forced sexual activity: Not on file  Other Topics Concern  . Not on file  Social History Narrative  . Not on file        Objective:    There were no vitals taken for this visit.  Physical Exam  No results found for: TSH No results found for: WBC, HGB, HCT, MCV, PLT No results found for: NA, K,  CHLORIDE, CO2, GLUCOSE, BUN, CREATININE, BILITOT, ALKPHOS, AST, ALT, PROT, ALBUMIN, CALCIUM, ANIONGAP, EGFR, GFR No results found for: CHOL No results found for: HDL No results found for: LDLCALC No results found for: TRIG No results found for: CHOLHDL No results found for: HGBA1C

## 2019-07-08 ENCOUNTER — Encounter: Payer: Self-pay | Admitting: Physician Assistant

## 2019-07-08 ENCOUNTER — Ambulatory Visit: Payer: Commercial Managed Care - PPO | Admitting: Physician Assistant

## 2019-07-08 ENCOUNTER — Other Ambulatory Visit: Payer: Commercial Managed Care - PPO

## 2019-07-08 VITALS — BP 178/111 | HR 83 | Temp 98.0°F | Resp 18 | Ht 70.0 in | Wt 216.8 lb

## 2019-07-08 DIAGNOSIS — I10 Essential (primary) hypertension: Secondary | ICD-10-CM | POA: Diagnosis not present

## 2019-07-08 DIAGNOSIS — E1169 Type 2 diabetes mellitus with other specified complication: Secondary | ICD-10-CM

## 2019-07-08 DIAGNOSIS — E785 Hyperlipidemia, unspecified: Secondary | ICD-10-CM | POA: Diagnosis not present

## 2019-07-08 DIAGNOSIS — N529 Male erectile dysfunction, unspecified: Secondary | ICD-10-CM

## 2019-07-08 LAB — BAYER DCA HB A1C WAIVED: HB A1C (BAYER DCA - WAIVED): 7.2 % — ABNORMAL HIGH (ref <7.0)

## 2019-07-08 MED ORDER — SPIRONOLACTONE 25 MG PO TABS: 25.0000 mg | ORAL_TABLET | Freq: Every day | ORAL | 1 refills | Status: DC

## 2019-07-08 NOTE — Progress Notes (Addendum)
BP (!) 178/111   Pulse 83   Temp 98 F (36.7 C) (Temporal)   Resp 18   Ht '5\' 10"'  (1.778 m)   Wt 216 lb 12.8 oz (98.3 kg)   SpO2 97%   BMI 31.11 kg/m    Subjective:    Patient ID: Julian Scott, male    DOB: 1966-03-12, 53 y.o.   MRN: [id number]  HPI: Julian Scott is a 53 y.o. male presenting on 07/08/2019 for Diabetes (2 month follow up)  We were able to work this patient in for his visit today.  There was a change in the schedule for the day.  Labs were performed.  The patient reports that overall he has been doing fairly well.  He states that he had not been taking his blood pressure medicine for a day or 2.  And he did have significant elevation of his blood pressure when he arrived here.  He denies any headache, neurologic symptoms, edema.  Patient also has diabetes.  He has an A1c of 7.2 today.  I encouraged him to really work on his carb intake, reduce sugar, soft drinks, even just cutting his current portions of carbs in half would be beneficial.  He agrees to try doing 70s.  He denies any numbness in his hands and feet.  All of his medications are reviewed today.  We will send refills if needed.  We will pass this note on to his PCP for him to come back in a month.   Past Medical History:  Diagnosis Date  . Diabetes mellitus ([place])   . Family history of prostate cancer in father 12/16/2015  . Hyperlipidemia   . Hypertension   . Primary insomnia 02/01/2017  . Testosterone deficiency    Relevant past medical, surgical, family and social history reviewed and updated as indicated. Interim medical history since our last visit reviewed. Allergies and medications reviewed and updated. DATA REVIEWED: CHART IN EPIC  Family History reviewed for pertinent findings.  Review of Systems  Constitutional: Negative.  Negative for appetite change and fatigue.  Eyes: Negative for pain and visual disturbance.  Respiratory: Negative.  Negative for cough, chest tightness,  shortness of breath and wheezing.   Cardiovascular: Negative.  Negative for chest pain, palpitations and leg swelling.  Gastrointestinal: Negative.  Negative for abdominal pain.  Genitourinary: Negative.   Skin: Negative.   Neurological: Negative.  Negative for weakness, numbness and headaches.  Psychiatric/Behavioral: Negative.    Diabetic Foot Exam - Simple   Simple Foot Form Diabetic Foot exam was performed with the following findings: Yes 07/08/2019  8:17 AM  Visual Inspection No deformities, no ulcerations, no other skin breakdown bilaterally: Yes Sensation Testing Intact to touch and monofilament testing bilaterally: Yes Pulse Check Posterior Tibialis and Dorsalis pulse intact bilaterally: Yes Comments     Allergies as of 07/08/2019   No Known Allergies     Medication List       Accurate as of July 08, 2019 11:59 PM. If you have any questions, ask your nurse or doctor.        amLODipine 10 MG tablet Commonly known as: NORVASC Take 1 tablet (10 mg total) by mouth daily.   atorvastatin 40 MG tablet Commonly known as: LIPITOR Take 1 tablet (40 mg total) by mouth daily at 6 PM.   glipiZIDE 5 MG 24 hr tablet Commonly known as: GLUCOTROL XL Take 1 tablet (5 mg total) by mouth daily with breakfast.   losartan-hydrochlorothiazide  100-25 MG tablet Commonly known as: HYZAAR Take 1 tablet by mouth daily.   sildenafil 100 MG tablet Commonly known as: Viagra Take 0.5-1 tablets (50-100 mg total) by mouth daily as needed for erectile dysfunction.   spironolactone 25 MG tablet Commonly known as: Aldactone Take 1 tablet (25 mg total) by mouth daily. Started by: Terald Sleeper, PA-C   tamsulosin 0.4 MG Caps capsule Commonly known as: FLOMAX Take 1 capsule (0.4 mg total) by mouth daily after supper.   testosterone cypionate 200 MG/ML injection Commonly known as: DEPOTESTOSTERONE CYPIONATE Inject 1 mL (200 mg total) into the muscle every 14 (fourteen) days. Start  taking on: July 28, 2019   zolpidem 10 MG tablet Commonly known as: AMBIEN Take 1 tablet (10 mg total) by mouth at bedtime as needed. for sleep          Objective:    BP (!) 178/111   Pulse 83   Temp 98 F (36.7 C) (Temporal)   Resp 18   Ht '5\' 10"'  (1.778 m)   Wt 216 lb 12.8 oz (98.3 kg)   SpO2 97%   BMI 31.11 kg/m   No Known Allergies  Wt Readings from Last 3 Encounters:  07/08/19 216 lb 12.8 oz (98.3 kg)  05/05/19 217 lb (98.4 kg)  08/23/15 225 lb 12.8 oz (102.4 kg)    Physical Exam Vitals signs and nursing note reviewed.  Constitutional:      General: He is not in acute distress.    Appearance: He is well-developed.  HENT:     Head: Normocephalic and atraumatic.  Eyes:     Conjunctiva/sclera: Conjunctivae normal.     Pupils: Pupils are equal, round, and reactive to light.  Cardiovascular:     Rate and Rhythm: Normal rate and regular rhythm.     Heart sounds: Normal heart sounds.  Pulmonary:     Effort: Pulmonary effort is normal. No respiratory distress.     Breath sounds: Normal breath sounds.  Skin:    General: Skin is warm and dry.  Psychiatric:        Behavior: Behavior normal.     Results for orders placed or performed in visit on 07/08/19  CMP14+EGFR  Result Value Ref Range   Glucose 110 (H) 65 - 99 mg/dL   BUN 20 6 - 24 mg/dL   Creatinine, Ser 1.65 (H) 0.76 - 1.27 mg/dL   GFR calc non Af Amer 47 (L) >59 mL/min/1.73   GFR calc Af Amer 55 (L) >59 mL/min/1.73   BUN/Creatinine Ratio 12 9 - 20   Sodium 139 134 - 144 mmol/L   Potassium 4.0 3.5 - 5.2 mmol/L   Chloride 98 96 - 106 mmol/L   CO2 27 20 - 29 mmol/L   Calcium 9.6 8.7 - 10.2 mg/dL   Total Protein 7.2 6.0 - 8.5 g/dL   Albumin 4.5 3.8 - 4.9 g/dL   Globulin, Total 2.7 1.5 - 4.5 g/dL   Albumin/Globulin Ratio 1.7 1.2 - 2.2   Bilirubin Total 1.8 (H) 0.0 - 1.2 mg/dL   Alkaline Phosphatase 85 39 - 117 IU/L   AST 24 0 - 40 IU/L   ALT 32 0 - 44 IU/L  Bayer DCA Hb A1c Waived  Result Value  Ref Range   HB A1C (BAYER DCA - WAIVED) 7.2 (H) <7.0 %  Microalbumin / creatinine urine ratio  Result Value Ref Range   Creatinine, Urine 43.3 Not Estab. mg/dL   Microalbumin, Urine 6.5 Not Estab.  ug/mL   Microalb/Creat Ratio 15 0 - 29 mg/g creat      Assessment & Plan:   1. Uncontrolled hypertension - CMP14+EGFR - spironolactone (ALDACTONE) 25 MG tablet; Take 1 tablet (25 mg total) by mouth daily.  Dispense: 30 tablet; Refill: 1  2. DM type 2 with diabetic dyslipidemia (HCC) - CMP14+EGFR - Bayer DCA Hb A1c Waived - Microalbumin / creatinine urine ratio  3. Erectile dysfunction, unspecified erectile dysfunction type - CMP14+EGFR   Continue all other maintenance medications as listed above.  Follow up plan: Return in about 4 weeks (around 08/05/2019) for recheck medications with PCP.  Educational handout given for carb counting  Terald Sleeper PA-C [facility] [address]  [place], [place] [address] [phone]   07/10/2019, 3:11 PM

## 2019-07-09 LAB — CMP14+EGFR
ALT: 32 IU/L (ref 0–44)
AST: 24 IU/L (ref 0–40)
Albumin/Globulin Ratio: 1.7 (ref 1.2–2.2)
Albumin: 4.5 g/dL (ref 3.8–4.9)
Alkaline Phosphatase: 85 IU/L (ref 39–117)
BUN/Creatinine Ratio: 12 (ref 9–20)
BUN: 20 mg/dL (ref 6–24)
Bilirubin Total: 1.8 mg/dL — ABNORMAL HIGH (ref 0.0–1.2)
CO2: 27 mmol/L (ref 20–29)
Calcium: 9.6 mg/dL (ref 8.7–10.2)
Chloride: 98 mmol/L (ref 96–106)
Creatinine, Ser: 1.65 mg/dL — ABNORMAL HIGH (ref 0.76–1.27)
GFR calc Af Amer: 55 mL/min/{1.73_m2} — ABNORMAL LOW (ref >59)
GFR calc non Af Amer: 47 mL/min/{1.73_m2} — ABNORMAL LOW (ref >59)
Globulin, Total: 2.7 g/dL (ref 1.5–4.5)
Glucose: 110 mg/dL — ABNORMAL HIGH (ref 65–99)
Potassium: 4 mmol/L (ref 3.5–5.2)
Sodium: 139 mmol/L (ref 134–144)
Total Protein: 7.2 g/dL (ref 6.0–8.5)

## 2019-07-09 LAB — MICROALBUMIN / CREATININE URINE RATIO
Creatinine, Urine: 43.3 mg/dL
Microalb/Creat Ratio: 15 mg/g creat (ref 0–29)
Microalbumin, Urine: 6.5 ug/mL

## 2019-07-11 ENCOUNTER — Encounter: Payer: Self-pay | Admitting: Family Medicine

## 2019-08-05 ENCOUNTER — Ambulatory Visit: Payer: Commercial Managed Care - PPO | Admitting: Family Medicine

## 2019-08-19 NOTE — Progress Notes (Deleted)
Assessment & Plan:  ***  No follow-ups on file.  [name], MSN, APRN, FNP-C [facility]  Subjective:    Patient ID: Julian Scott, male    DOB: October 26, 1965, 53 y.o.   MRN: [id number]  Patient Care Team: Gwenlyn Fudge, FNP as PCP - General (Family Medicine)   Chief Complaint: No chief complaint on file.   HPI: Julian Scott is a 53 y.o. male presenting on 08/21/2019 for No chief complaint on file.  Hypertension: Patient here for follow-up of elevated blood pressure. He {is/is not:9024} exercising and {is/is not:9024} adherent to low salt diet.  Blood pressure {is/is not:9024} well controlled at home. Cardiac symptoms {Symptoms; cardiac:12860}. Patient denies {Symptoms; cardiac:12860}.  Cardiovascular risk factors: diabetes mellitus, dyslipidemia, hypertension, male gender and obesity (BMI >= 30 kg/m2). Use of agents associated with hypertension: steroids. History of target organ damage: none.   New complaints: ***  Social history:  Relevant past medical, surgical, family and social history reviewed and updated as indicated. Interim medical history since our last visit reviewed.  Allergies and medications reviewed and updated.  DATA REVIEWED: CHART IN EPIC  ROS: Negative unless specifically indicated above in HPI.    Current Outpatient Medications:  .  amLODipine (NORVASC) 10 MG tablet, Take 1 tablet (10 mg total) by mouth daily., Disp: 90 tablet, Rfl: 1 .  atorvastatin (LIPITOR) 40 MG tablet, Take 1 tablet (40 mg total) by mouth daily at 6 PM., Disp: 90 tablet, Rfl: 1 .  glipiZIDE (GLUCOTROL XL) 5 MG 24 hr tablet, Take 1 tablet (5 mg total) by mouth daily with breakfast., Disp: 90 tablet, Rfl: 1 .  losartan-hydrochlorothiazide (HYZAAR) 100-25 MG tablet, Take 1 tablet by mouth daily., Disp: 90 tablet, Rfl: 1 .  sildenafil (VIAGRA) 100 MG tablet, Take 0.5-1 tablets (50-100 mg total) by mouth daily as needed for erectile dysfunction.,  Disp: 30 tablet, Rfl: 0 .  spironolactone (ALDACTONE) 25 MG tablet, Take 1 tablet (25 mg total) by mouth daily., Disp: 30 tablet, Rfl: 1 .  tamsulosin (FLOMAX) 0.4 MG CAPS capsule, Take 1 capsule (0.4 mg total) by mouth daily after supper., Disp: 90 capsule, Rfl: 1 .  testosterone cypionate (DEPOTESTOSTERONE CYPIONATE) 200 MG/ML injection, Inject 1 mL (200 mg total) into the muscle every 14 (fourteen) days., Disp: 2 mL, Rfl: 2 .  zolpidem (AMBIEN) 10 MG tablet, Take 1 tablet (10 mg total) by mouth at bedtime as needed. for sleep, Disp: 30 tablet, Rfl: 5   No Known Allergies Past Medical History:  Diagnosis Date  . Diabetes mellitus ([facility])   . Family history of prostate cancer in father 12/16/2015  . Hyperlipidemia   . Hypertension   . Primary insomnia 02/01/2017  . Testosterone deficiency     Past Surgical History:  Procedure Laterality Date  . Cyst Removal Back    . CYST REMOVAL HAND      Social History   Socioeconomic History  . Marital status: Married    Spouse name: Not on file  . Number of children: Not on file  . Years of education: Not on file  . Highest education level: Not on file  Occupational History  . Not on file  Social Needs  . Financial resource strain: Not on file  . Food insecurity    Worry: Not on file    Inability: Not on file  . Transportation needs    Medical: Not on file    Non-medical: Not on file  Tobacco Use  .  Smoking status: Never Smoker  . Smokeless tobacco: Never Used  Substance and Sexual Activity  . Alcohol use: Not Currently    Comment: Every blue moon - socially  . Drug use: Never  . Sexual activity: Not on file  Lifestyle  . Physical activity    Days per week: Not on file    Minutes per session: Not on file  . Stress: Not on file  Relationships  . Social Herbalist on phone: Not on file    Gets together: Not on file    Attends religious service: Not on file    Active member of club or organization: Not on file     Attends meetings of clubs or organizations: Not on file    Relationship status: Not on file  . Intimate partner violence    Fear of current or ex partner: Not on file    Emotionally abused: Not on file    Physically abused: Not on file    Forced sexual activity: Not on file  Other Topics Concern  . Not on file  Social History Narrative  . Not on file        Objective:    There were no vitals taken for this visit.  Physical Exam  No results found for: TSH No results found for: WBC, HGB, HCT, MCV, PLT Lab Results  Component Value Date   NA 139 07/08/2019   K 4.0 07/08/2019   CO2 27 07/08/2019   GLUCOSE 110 (H) 07/08/2019   BUN 20 07/08/2019   CREATININE 1.65 (H) 07/08/2019   BILITOT 1.8 (H) 07/08/2019   ALKPHOS 85 07/08/2019   AST 24 07/08/2019   ALT 32 07/08/2019   PROT 7.2 07/08/2019   ALBUMIN 4.5 07/08/2019   CALCIUM 9.6 07/08/2019   No results found for: CHOL No results found for: HDL No results found for: LDLCALC No results found for: TRIG No results found for: [facility] Lab Results  Component Value Date   HGBA1C 7.2 (H) 07/08/2019

## 2019-08-21 ENCOUNTER — Ambulatory Visit: Payer: Commercial Managed Care - PPO | Admitting: Family Medicine

## 2019-10-13 LAB — HM DIABETES EYE EXAM

## 2019-10-29 ENCOUNTER — Other Ambulatory Visit: Payer: Self-pay | Admitting: Family Medicine

## 2019-10-29 DIAGNOSIS — F5101 Primary insomnia: Secondary | ICD-10-CM

## 2019-10-31 ENCOUNTER — Ambulatory Visit (INDEPENDENT_AMBULATORY_CARE_PROVIDER_SITE_OTHER): Payer: Commercial Managed Care - PPO | Admitting: Family Medicine

## 2019-10-31 ENCOUNTER — Encounter: Payer: Self-pay | Admitting: Family Medicine

## 2019-10-31 DIAGNOSIS — F5101 Primary insomnia: Secondary | ICD-10-CM | POA: Diagnosis not present

## 2019-10-31 DIAGNOSIS — I10 Essential (primary) hypertension: Secondary | ICD-10-CM | POA: Diagnosis not present

## 2019-10-31 DIAGNOSIS — E782 Mixed hyperlipidemia: Secondary | ICD-10-CM

## 2019-10-31 DIAGNOSIS — E1169 Type 2 diabetes mellitus with other specified complication: Secondary | ICD-10-CM

## 2019-10-31 DIAGNOSIS — Z1211 Encounter for screening for malignant neoplasm of colon: Secondary | ICD-10-CM

## 2019-10-31 DIAGNOSIS — E785 Hyperlipidemia, unspecified: Secondary | ICD-10-CM

## 2019-10-31 MED ORDER — SPIRONOLACTONE 25 MG PO TABS: 25.0000 mg | ORAL_TABLET | Freq: Every day | ORAL | 1 refills | Status: DC

## 2019-10-31 MED ORDER — TAMSULOSIN HCL 0.4 MG PO CAPS: 0.4000 mg | ORAL_CAPSULE | Freq: Every day | ORAL | 1 refills | Status: DC

## 2019-10-31 MED ORDER — AMLODIPINE BESYLATE 10 MG PO TABS: 10.0000 mg | ORAL_TABLET | Freq: Every day | ORAL | 1 refills | Status: DC

## 2019-10-31 MED ORDER — ZOLPIDEM TARTRATE ER 6.25 MG PO TBCR: 6.2500 mg | EXTENDED_RELEASE_TABLET | Freq: Every evening | ORAL | 2 refills | Status: DC | PRN

## 2019-10-31 MED ORDER — GLIPIZIDE ER 5 MG PO TB24: 5.0000 mg | ORAL_TABLET | Freq: Every day | ORAL | 1 refills | Status: DC

## 2019-10-31 MED ORDER — ATORVASTATIN CALCIUM 40 MG PO TABS: 40.0000 mg | ORAL_TABLET | Freq: Every day | ORAL | 1 refills | Status: DC

## 2019-10-31 MED ORDER — LOSARTAN POTASSIUM-HCTZ 100-25 MG PO TABS: 1.0000 | ORAL_TABLET | Freq: Every day | ORAL | 1 refills | Status: DC

## 2019-10-31 NOTE — Progress Notes (Signed)
Virtual Visit via Telephone Note  I connected with Julian Scott on 10/31/19 at 8:17 AM by telephone and verified that I am speaking with the correct person using two identifiers. Julian Scott is currently located at work and nobody is currently with him during this visit. The provider, Loman Brooklyn, FNP is located in their office at time of visit.  I discussed the limitations, risks, security and privacy concerns of performing an evaluation and management service by telephone and the availability of in person appointments. I also discussed with the patient that there may be a patient responsible charge related to this service. The patient expressed understanding and agreed to proceed.  Subjective: PCP: Loman Brooklyn, FNP  Chief Complaint  Patient presents with  . Medical Management of Chronic Issues   Insomnia: Patient reports the Ambien helps him fall asleep well but he is not staying asleep after about four hours.   Hypertension: Patient here for follow-up of elevated blood pressure. He is exercising and is adherent to low salt diet. He recently started in a new program at the gym as well. Patient does not check BP at home. Cardiac symptoms none. Cardiovascular risk factors: diabetes mellitus, dyslipidemia, hypertension, male gender and obesity (BMI >= 30 kg/m2). Use of agents associated with hypertension: steroids. History of target organ damage: none.  Diabetes: Patient presents for follow up of diabetes. Current symptoms include: none. Known diabetic complications: none. Medication compliance: yes. Current exercise: participating in a program at the gym.  Is he  on ACE inhibitor or angiotensin II receptor blocker? Yes. Is he on a statin? Yes.   Lab Results  Component Value Date   HGBA1C 7.2 (H) 07/08/2019   Lab Results  Component Value Date   CREATININE 1.65 (H) 07/08/2019      ROS: Per HPI  Current Outpatient Medications:  .  amLODipine (NORVASC) 10 MG  tablet, Take 1 tablet (10 mg total) by mouth daily., Disp: 90 tablet, Rfl: 1 .  atorvastatin (LIPITOR) 40 MG tablet, Take 1 tablet (40 mg total) by mouth daily at 6 PM., Disp: 90 tablet, Rfl: 1 .  glipiZIDE (GLUCOTROL XL) 5 MG 24 hr tablet, Take 1 tablet (5 mg total) by mouth daily with breakfast., Disp: 90 tablet, Rfl: 1 .  losartan-hydrochlorothiazide (HYZAAR) 100-25 MG tablet, Take 1 tablet by mouth daily., Disp: 90 tablet, Rfl: 1 .  sildenafil (VIAGRA) 100 MG tablet, Take 0.5-1 tablets (50-100 mg total) by mouth daily as needed for erectile dysfunction., Disp: 30 tablet, Rfl: 0 .  spironolactone (ALDACTONE) 25 MG tablet, Take 1 tablet (25 mg total) by mouth daily., Disp: 90 tablet, Rfl: 1 .  tamsulosin (FLOMAX) 0.4 MG CAPS capsule, Take 1 capsule (0.4 mg total) by mouth daily after supper., Disp: 90 capsule, Rfl: 1 .  testosterone cypionate (DEPOTESTOSTERONE CYPIONATE) 200 MG/ML injection, Inject 1 mL (200 mg total) into the muscle every 14 (fourteen) days., Disp: 2 mL, Rfl: 2 .  zolpidem (AMBIEN CR) 6.25 MG CR tablet, Take 1 tablet (6.25 mg total) by mouth at bedtime as needed for sleep., Disp: 30 tablet, Rfl: 2  No Known Allergies Past Medical History:  Diagnosis Date  . Diabetes mellitus ([place])   . Family history of prostate cancer in father 12/16/2015  . Hyperlipidemia   . Hypertension   . Primary insomnia 02/01/2017  . Testosterone deficiency     Observations/Objective: A&O  No respiratory distress or wheezing audible over the phone Mood, judgement, and thought processes  all WNL  Assessment and Plan: 1. Uncontrolled hypertension - Previously uncontrolled. Unable to assess for control due to manner of visit. Patient is not checking BP at home. Encouraged to continue diet and exercise.  - spironolactone (ALDACTONE) 25 MG tablet; Take 1 tablet (25 mg total) by mouth daily.  Dispense: 90 tablet; Refill: 1 - losartan-hydrochlorothiazide (HYZAAR) 100-25 MG tablet; Take 1 tablet by mouth  daily.  Dispense: 90 tablet; Refill: 1 - atorvastatin (LIPITOR) 40 MG tablet; Take 1 tablet (40 mg total) by mouth daily at 6 PM.  Dispense: 90 tablet; Refill: 1 - amLODipine (NORVASC) 10 MG tablet; Take 1 tablet (10 mg total) by mouth daily.  Dispense: 90 tablet; Refill: 1 - Lipid Panel; Future - CMP14+EGFR; Future  2. DM type 2 with diabetic dyslipidemia ([place]) Lab Results  Component Value Date   HGBA1C 7.2 (H) 07/08/2019   - Diabetes is not at goal of A1c < 7. Patient will come next week for A1c.  - Medications: continue current medications - Patient is currently taking a statin. Patient is taking an ACE-inhibitor/ARB.  - Last foot exam: 07/08/2019 - Last diabetic eye exam: UTD per patient; requested from [facility] in [place].  - Urine Microalbumin/Creat Ratio: 07/08/2019 - glipiZIDE (GLUCOTROL XL) 5 MG 24 hr tablet; Take 1 tablet (5 mg total) by mouth daily with breakfast.  Dispense: 90 tablet; Refill: 1 - Lipid Panel; Future - CMP14+EGFR; Future - HgbA1C; Future  3. Mixed hyperlipidemia - Previously uncontrolled. Patient to come for lab work next week.  - atorvastatin (LIPITOR) 40 MG tablet; Take 1 tablet (40 mg total) by mouth daily at 6 PM.  Dispense: 90 tablet; Refill: 1 - Lipid Panel; Future - CMP14+EGFR; Future  4. Primary insomnia - Changing patient from Ambien 10 mg QHS to Ambien CR 6.25 mg due to trouble maintaining sleep.  - zolpidem (AMBIEN CR) 6.25 MG CR tablet; Take 1 tablet (6.25 mg total) by mouth at bedtime as needed for sleep.  Dispense: 30 tablet; Refill: 2  5. Colon cancer screening - Cologuard   Follow Up Instructions:  Return in about 3 months (around 01/29/2020) for DM.  I discussed the assessment and treatment plan with the patient. The patient was provided an opportunity to ask questions and all were answered. The patient agreed with the plan and demonstrated an understanding of the instructions.   The patient was advised to call back or  seek an in-person evaluation if the symptoms worsen or if the condition fails to improve as anticipated.  The above assessment and management plan was discussed with the patient. The patient verbalized understanding of and has agreed to the management plan. Patient is aware to call the clinic if symptoms persist or worsen. Patient is aware when to return to the clinic for a follow-up visit. Patient educated on when it is appropriate to go to the emergency department.   Time call ended: 8:31 AM  I provided 16 minutes of non-face-to-face time during this encounter.  [name], MSN, APRN, FNP-C [facility] 10/31/19

## 2019-11-03 ENCOUNTER — Telehealth: Payer: Self-pay | Admitting: Family Medicine

## 2019-11-03 NOTE — Telephone Encounter (Signed)
Spoke with pt and advised it was changed. She sent over the controlled release Ambien. Pt states he hasn't gotten any sleep in the past 3 nights and would like to just go back on the Ambien 10mg  since he was at least able to sleep 4 hours or so.

## 2019-11-04 NOTE — Telephone Encounter (Signed)
Are we positive he picked up the new prescription and not a refill of the original? If so, would he like to try a totally different medication?

## 2019-11-04 NOTE — Telephone Encounter (Signed)
Spoke with the pharmacist and they said pt did pick up the Ambien 6.25 CR on the 15th and then I called the pt and he says if you think trying a completely different medication would work he is fine with that. He just wants to get some sleep.

## 2019-11-05 MED ORDER — TEMAZEPAM 15 MG PO CAPS: 15.0000 mg | ORAL_CAPSULE | Freq: Every evening | ORAL | 0 refills | Status: DC | PRN

## 2019-11-05 NOTE — Telephone Encounter (Signed)
I sent a new prescription for temazepam 15 mg to take at bedtime. I only sent 14 tablets this time so we can see if he likes it. If so, I will send more, he just needs to let me know.

## 2019-11-05 NOTE — Telephone Encounter (Signed)
Pt aware of provider feedback and voiced understanding. 

## 2019-11-18 ENCOUNTER — Other Ambulatory Visit: Payer: Self-pay | Admitting: Family Medicine

## 2019-11-18 LAB — COLOGUARD

## 2019-11-24 ENCOUNTER — Other Ambulatory Visit: Payer: Self-pay | Admitting: Family Medicine

## 2019-11-24 DIAGNOSIS — E291 Testicular hypofunction: Secondary | ICD-10-CM

## 2019-12-09 LAB — COLOGUARD: COLOGUARD: NEGATIVE

## 2019-12-11 ENCOUNTER — Telehealth: Payer: Self-pay | Admitting: Family Medicine

## 2019-12-11 DIAGNOSIS — Z1211 Encounter for screening for malignant neoplasm of colon: Secondary | ICD-10-CM

## 2019-12-11 NOTE — Telephone Encounter (Signed)
Pt would like to move forward with a colonoscopy referral. He would like to go to [facility].

## 2019-12-11 NOTE — Telephone Encounter (Signed)
Referral placed.

## 2019-12-11 NOTE — Telephone Encounter (Signed)
Please call patient to discuss Cologuard.  I see that he did complete the test but the report states the sample could not be processed as the sample limit had been exceeded.  Please ask him if they went ahead and sent him a new test.  If not, would he rather me order a new Cologuard test or refer him for colonoscopy?  I know initially he was agreeable to a colonoscopy but we went with Cologuard because they were not doing colonoscopies at that time but they have resumed them.

## 2019-12-12 ENCOUNTER — Encounter: Payer: Self-pay | Admitting: Gastroenterology

## 2019-12-22 ENCOUNTER — Other Ambulatory Visit: Payer: Self-pay

## 2019-12-22 ENCOUNTER — Ambulatory Visit (AMBULATORY_SURGERY_CENTER): Payer: Self-pay | Admitting: *Deleted

## 2019-12-22 VITALS — Temp 98.0°F | Ht 70.0 in | Wt 222.0 lb

## 2019-12-22 DIAGNOSIS — Z01818 Encounter for other preprocedural examination: Secondary | ICD-10-CM

## 2019-12-22 DIAGNOSIS — Z1211 Encounter for screening for malignant neoplasm of colon: Secondary | ICD-10-CM

## 2019-12-22 MED ORDER — NA SULFATE-K SULFATE-MG SULF 17.5-3.13-1.6 GM/177ML PO SOLN: ORAL | 0 refills | Status: DC

## 2019-12-22 NOTE — Progress Notes (Signed)
Wife in PV with the patient (temp 97.3). Patient is here in-person for PV. Patient denies any allergies to eggs or soy. Patient denies any problems with anesthesia/sedation. Patient denies any oxygen use at home. Patient denies taking any diet/weight loss medications or blood thinners. Patient is not being treated for MRSA or C-diff. EMMI education assisgned to the patient for the procedure, this was explained and instructions given to patient. COVID-19 screening test is on 3/18, the pt is aware. Pt is aware that care partner will wait in the car during procedure; if they feel like they will be too hot or cold to wait in the car; they may wait in the 4 th floor lobby. Patient is aware to bring only one care partner. We want them to wear a mask (we do not have any that we can provide them), practice social distancing, and we will check their temperatures when they get here.  I did remind the patient that their care partner needs to stay in the parking lot the entire time and have a cell phone available, we will call them when the pt is ready for discharge. Patient will wear mask into building.    Suprep $15 off coupon given to the patient.

## 2019-12-23 LAB — COLOGUARD: Cologuard: NEGATIVE

## 2019-12-30 ENCOUNTER — Telehealth: Payer: Self-pay | Admitting: Family Medicine

## 2019-12-30 NOTE — Telephone Encounter (Signed)
There are two results on [employer]. The first dated 11/15/2019 says the sample could not be processed as the sample limit had been exceeded. The second dated 12/03/2019 has a negative result.

## 2019-12-30 NOTE — Telephone Encounter (Signed)
Our last Cologuard says negative?

## 2019-12-30 NOTE — Telephone Encounter (Signed)
Called and patient and discussed. He has a NEG cologaurd in file from 11/25/19. He may cancel colonoscopy for next week. He is aware to contact us if any GI symptoms arise = but to plan on another COLOgaurd in 3 years.

## 2019-12-30 NOTE — Telephone Encounter (Signed)
Pt says he has an appt scheduled next week for Colonoscopy but says he received a letter from Colog[facility] stating that the stool sample he sent in was completed and his PCP had the results. Pt is trying to understand what is going on because he was originally told that the stool sample he sent in was too much. Pt would like to speak with provider or nurse regarding this issue.

## 2020-01-01 ENCOUNTER — Other Ambulatory Visit (HOSPITAL_COMMUNITY): Payer: Commercial Managed Care - PPO

## 2020-01-06 ENCOUNTER — Encounter: Payer: Self-pay | Admitting: Gastroenterology

## 2020-02-20 ENCOUNTER — Other Ambulatory Visit: Payer: Self-pay | Admitting: Family Medicine

## 2020-02-25 ENCOUNTER — Telehealth: Payer: Self-pay | Admitting: Family Medicine

## 2020-02-25 NOTE — Telephone Encounter (Signed)
  Prescription Request  02/25/2020  What is the name of the medication or equipment? temazepam (RESTORIL) 15 MG capsule    Have you contacted your pharmacy to request a refill? 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 [id number] - [place], [place] - [address] [facility] [address]     Patient notified that their request is being sent to the clinical staff for review and that they should receive a response within 2 business days.

## 2020-02-25 NOTE — Telephone Encounter (Signed)
This has already been sent to [facility] from [facility]. Please advise

## 2020-02-26 NOTE — Telephone Encounter (Signed)
Will address in refill encounter

## 2020-02-26 NOTE — Telephone Encounter (Signed)
Patient needs to schedule a follow-up.

## 2020-04-21 ENCOUNTER — Other Ambulatory Visit: Payer: Self-pay | Admitting: Family Medicine

## 2020-04-21 DIAGNOSIS — E291 Testicular hypofunction: Secondary | ICD-10-CM

## 2020-04-22 ENCOUNTER — Other Ambulatory Visit: Payer: Self-pay | Admitting: Family Medicine

## 2020-04-22 NOTE — Telephone Encounter (Signed)
Last refill was given 02/26/20 and pt was supposed to schedule follow up  No appt scheduled   ntbs for refills

## 2020-04-27 ENCOUNTER — Ambulatory Visit (INDEPENDENT_AMBULATORY_CARE_PROVIDER_SITE_OTHER): Payer: Commercial Managed Care - PPO | Admitting: Family Medicine

## 2020-04-27 ENCOUNTER — Encounter: Payer: Self-pay | Admitting: Family Medicine

## 2020-04-27 ENCOUNTER — Other Ambulatory Visit: Payer: Self-pay

## 2020-04-27 VITALS — BP 185/114 | HR 86 | Temp 97.6°F | Ht 70.0 in | Wt 214.4 lb

## 2020-04-27 DIAGNOSIS — F5101 Primary insomnia: Secondary | ICD-10-CM | POA: Diagnosis not present

## 2020-04-27 DIAGNOSIS — E1169 Type 2 diabetes mellitus with other specified complication: Secondary | ICD-10-CM

## 2020-04-27 DIAGNOSIS — E782 Mixed hyperlipidemia: Secondary | ICD-10-CM | POA: Diagnosis not present

## 2020-04-27 DIAGNOSIS — I10 Essential (primary) hypertension: Secondary | ICD-10-CM | POA: Diagnosis not present

## 2020-04-27 DIAGNOSIS — E785 Hyperlipidemia, unspecified: Secondary | ICD-10-CM

## 2020-04-27 DIAGNOSIS — E291 Testicular hypofunction: Secondary | ICD-10-CM

## 2020-04-27 DIAGNOSIS — N1831 Chronic kidney disease, stage 3a: Secondary | ICD-10-CM | POA: Insufficient documentation

## 2020-04-27 LAB — BAYER DCA HB A1C WAIVED: HB A1C (BAYER DCA - WAIVED): 6.9 % (ref <7.0)

## 2020-04-27 MED ORDER — METOPROLOL SUCCINATE ER 25 MG PO TB24: 25.0000 mg | ORAL_TABLET | Freq: Every day | ORAL | 2 refills | Status: DC

## 2020-04-27 MED ORDER — TAMSULOSIN HCL 0.4 MG PO CAPS: 0.4000 mg | ORAL_CAPSULE | Freq: Every day | ORAL | 1 refills | Status: DC

## 2020-04-27 MED ORDER — AMLODIPINE BESYLATE 10 MG PO TABS: 10.0000 mg | ORAL_TABLET | Freq: Every day | ORAL | 1 refills | Status: DC

## 2020-04-27 MED ORDER — TEMAZEPAM 30 MG PO CAPS: 30.0000 mg | ORAL_CAPSULE | Freq: Every evening | ORAL | 2 refills | Status: DC | PRN

## 2020-04-27 MED ORDER — ATORVASTATIN CALCIUM 40 MG PO TABS: 40.0000 mg | ORAL_TABLET | Freq: Every day | ORAL | 1 refills | Status: DC

## 2020-04-27 MED ORDER — LOSARTAN POTASSIUM-HCTZ 100-25 MG PO TABS: 1.0000 | ORAL_TABLET | Freq: Every day | ORAL | 1 refills | Status: DC

## 2020-04-27 NOTE — Progress Notes (Signed)
Assessment & Plan:  1. DM type 2 with diabetic dyslipidemia ([place]) Lab Results  Component Value Date   HGBA1C 6.9 04/27/2020   HGBA1C 7.2 (H) 07/08/2019  - Diabetes is at goal of A1c < 7. - Medications: continue current medications - Home glucose monitoring: does not check - Patient is currently taking a statin. Patient is taking an ACE-inhibitor/ARB.  - Last foot exam: 04/27/2020 - Last diabetic eye exam: 10/13/2019 - Urine Microalbumin/Creat Ratio: 07/08/2019 - Bayer DCA Hb A1c Waived - atorvastatin (LIPITOR) 40 MG tablet; Take 1 tablet (40 mg total) by mouth daily at 6 PM.  Dispense: 90 tablet; Refill: 1 - CBC with Differential/Platelet - CMP14+EGFR - Lipid panel  2. Uncontrolled hypertension - Uncontrolled. Continue amlodipine and losartan-HCTZ. Added metoprolol XL 25 mg QD. Encouraged patient to start monitoring BP at home. Education provided on the DASH diet.  - atorvastatin (LIPITOR) 40 MG tablet; Take 1 tablet (40 mg total) by mouth daily at 6 PM.  Dispense: 90 tablet; Refill: 1 - amLODipine (NORVASC) 10 MG tablet; Take 1 tablet (10 mg total) by mouth daily.  Dispense: 90 tablet; Refill: 1 - losartan-hydrochlorothiazide (HYZAAR) 100-25 MG tablet; Take 1 tablet by mouth daily.  Dispense: 90 tablet; Refill: 1 - CBC with Differential/Platelet - CMP14+EGFR - Lipid panel - metoprolol succinate (TOPROL XL) 25 MG 24 hr tablet; Take 1 tablet (25 mg total) by mouth daily.  Dispense: 30 tablet; Refill: 2  3. Primary insomnia - Improving. Temazepam increased from 15 mg to 30 mg QHS.  - temazepam (RESTORIL) 30 MG capsule; Take 1 capsule (30 mg total) by mouth at bedtime as needed for sleep.  Dispense: 30 capsule; Refill: 2 - Compliance Drug Analysis, Ur  4. Mixed hyperlipidemia - Lipid panel - atorvastatin (LIPITOR) 40 MG tablet; Take 1 tablet (40 mg total) by mouth daily at 6 PM.  Dispense: 90 tablet; Refill: 1  5. Stage 3a chronic kidney disease - CMP14+EGFR  6. Hypogonadism  in male - Testosterone,Free and Total   Return in about 6 weeks (around 06/08/2020) for HTN & sleep.  Julian Limes, MSN, APRN, FNP-C [facility]  Subjective:    Patient ID: Julian Scott, male    DOB: 09/02/1966, 54 y.o.   MRN: [id number]  Patient Care Team: Loman Brooklyn, FNP as PCP - General (Family Medicine)   Chief Complaint:  Chief Complaint  Patient presents with  . Hypertension    check up of chronic medical conditions  . Diabetes  . not sleeping    Patient states that he is still having trouble sleeping through the night.    HPI: Julian Scott is a 54 y.o. male presenting on 04/27/2020 for Hypertension (check up of chronic medical conditions), Diabetes, and not sleeping (Patient states that he is still having trouble sleeping through the night.)  Diabetes: Patient presents for follow up of diabetes. Known diabetic complications: none. Medication compliance: yes. Current diet: in general, a "healthy" diet  . Current exercise: swimming. Home blood sugar records: patient does not check sugars. Is he  on ACE inhibitor or angiotensin II receptor blocker? Yes. Is he on a statin? Yes.   A1c 6.9 today; improved from 7.2 on 07/08/2019.   Lab Results  Component Value Date   CREATININE 1.65 (H) 07/08/2019    Hypertension: Patient here for follow-up of elevated blood pressure. He is exercising and is not adherent to low salt diet. Patient does not check his BP at home. Cardiac symptoms  none.  Cardiovascular risk factors: diabetes mellitus, dyslipidemia, hypertension, male gender and obesity (BMI >= 30 kg/m2). Use of agents associated with hypertension: steroids. History of target organ damage: chronic kidney disease.  Insomnia: patient reports he is not sleeping through the night. He does feel the temazepam helped some, but not enough.   New complaints: Patient mentioned he is going through a divorce and has been dealing with that stress. Denies  any depression.    Social history:  Relevant past medical, surgical, family and social history reviewed and updated as indicated. Interim medical history since our last visit reviewed.  Allergies and medications reviewed and updated.  DATA REVIEWED: CHART IN EPIC  ROS: Negative unless specifically indicated above in HPI.    Current Outpatient Medications:  .  amLODipine (NORVASC) 10 MG tablet, Take 1 tablet (10 mg total) by mouth daily., Disp: 90 tablet, Rfl: 1 .  atorvastatin (LIPITOR) 40 MG tablet, Take 1 tablet (40 mg total) by mouth daily at 6 PM., Disp: 90 tablet, Rfl: 1 .  glipiZIDE (GLUCOTROL XL) 5 MG 24 hr tablet, Take 1 tablet (5 mg total) by mouth daily with breakfast., Disp: 90 tablet, Rfl: 1 .  losartan-hydrochlorothiazide (HYZAAR) 100-25 MG tablet, Take 1 tablet by mouth daily., Disp: 90 tablet, Rfl: 1 .  sildenafil (VIAGRA) 100 MG tablet, Take 0.5-1 tablets (50-100 mg total) by mouth daily as needed for erectile dysfunction., Disp: 30 tablet, Rfl: 0 .  tamsulosin (FLOMAX) 0.4 MG CAPS capsule, Take 1 capsule (0.4 mg total) by mouth daily after supper., Disp: 90 capsule, Rfl: 1 .  temazepam (RESTORIL) 15 MG capsule, TAKE 1 CAPSULE (15 MG TOTAL) BY MOUTH AT BEDTIME AS NEEDED FOR SLEEP., Disp: 30 capsule, Rfl: 2 .  testosterone cypionate (DEPOTESTOSTERONE CYPIONATE) 200 MG/ML injection, INJECT 1 ML (200 MG TOTAL) INTO THE MUSCLE EVERY 14 (FOURTEEN) DAYS., Disp: 2 mL, Rfl: 4   No Known Allergies Past Medical History:  Diagnosis Date  . Diabetes mellitus ([place])   . Family history of prostate cancer in father 12/16/2015  . Hyperlipidemia   . Hypertension   . Primary insomnia 02/01/2017  . Testosterone deficiency     Past Surgical History:  Procedure Laterality Date  . Cyst Removal Back    . CYST REMOVAL HAND      Social History   Socioeconomic History  . Marital status: Married    Spouse name: Not on file  . Number of children: Not on file  . Years of education: Not on  file  . Highest education level: Not on file  Occupational History  . Not on file  Tobacco Use  . Smoking status: Never Smoker  . Smokeless tobacco: Never Used  Vaping Use  . Vaping Use: Never used  Substance and Sexual Activity  . Alcohol use: Not Currently    Comment: Every blue moon - socially  . Drug use: Never  . Sexual activity: Not on file  Other Topics Concern  . Not on file  Social History Narrative  . Not on file   Social Determinants of Health   Financial Resource Strain:   . Difficulty of Paying Living Expenses:   Food Insecurity:   . Worried About Charity fundraiser in the Last Year:   . Arboriculturist in the Last Year:   Transportation Needs:   . Film/video editor (Medical):   Marland Kitchen Lack of Transportation (Non-Medical):   Physical Activity:   . Days of Exercise per Week:   .  Minutes of Exercise per Session:   Stress:   . Feeling of Stress :   Social Connections:   . Frequency of Communication with Friends and Family:   . Frequency of Social Gatherings with Friends and Family:   . Attends Religious Services:   . Active Member of Clubs or Organizations:   . Attends Archivist Meetings:   Marland Kitchen Marital Status:   Intimate Partner Violence:   . Fear of Current or Ex-Partner:   . Emotionally Abused:   Marland Kitchen Physically Abused:   . Sexually Abused:         Objective:    BP (!) 185/114   Pulse 86   Temp 97.6 F (36.4 C) (Temporal)   Ht _0  (1.778 m)   Wt 214 lb 6.4 oz (97.3 kg)   SpO2 99%   BMI 30.76 kg/m   Wt Readings from Last 3 Encounters:  04/27/20 214 lb 6.4 oz (97.3 kg)  12/22/19 222 lb (100.7 kg)  07/08/19 216 lb 12.8 oz (98.3 kg)    Physical Exam Vitals reviewed.  Constitutional:      General: He is not in acute distress.    Appearance: Normal appearance. He is obese. He is not ill-appearing, toxic-appearing or diaphoretic.  HENT:     Head: Normocephalic and atraumatic.  Eyes:     General: No scleral icterus.       Right  eye: No discharge.        Left eye: No discharge.     Conjunctiva/sclera: Conjunctivae normal.  Cardiovascular:     Rate and Rhythm: Normal rate and regular rhythm.     Heart sounds: Normal heart sounds. No murmur heard.  No friction rub. No gallop.   Pulmonary:     Effort: Pulmonary effort is normal. No respiratory distress.     Breath sounds: Normal breath sounds. No stridor. No wheezing, rhonchi or rales.  Musculoskeletal:        General: Normal range of motion.     Cervical back: Normal range of motion.  Skin:    General: Skin is warm and dry.  Neurological:     Mental Status: He is alert and oriented to person, place, and time. Mental status is at baseline.  Psychiatric:        Mood and Affect: Mood normal.        Behavior: Behavior normal.        Thought Content: Thought content normal.        Judgment: Judgment normal.    Diabetic Foot Exam - Simple   Simple Foot Form Diabetic Foot exam was performed with the following findings: Yes 04/27/2020  9:42 AM  Visual Inspection No deformities, no ulcerations, no other skin breakdown bilaterally: Yes Sensation Testing Intact to touch and monofilament testing bilaterally: Yes Pulse Check Posterior Tibialis and Dorsalis pulse intact bilaterally: Yes Comments     No results found for: TSH No results found for: WBC, HGB, HCT, MCV, PLT Lab Results  Component Value Date   NA 139 07/08/2019   K 4.0 07/08/2019   CO2 27 07/08/2019   GLUCOSE 110 (H) 07/08/2019   BUN 20 07/08/2019   CREATININE 1.65 (H) 07/08/2019   BILITOT 1.8 (H) 07/08/2019   ALKPHOS 85 07/08/2019   AST 24 07/08/2019   ALT 32 07/08/2019   PROT 7.2 07/08/2019   ALBUMIN 4.5 07/08/2019   CALCIUM 9.6 07/08/2019   No results found for: CHOL No results found for: HDL No results found for:  [place] No results found for: TRIG No results found for: [facility] Lab Results  Component Value Date   HGBA1C 6.9 04/27/2020

## 2020-04-27 NOTE — Patient Instructions (Signed)
DASH Eating Plan DASH stands for "Dietary Approaches to Stop Hypertension." The DASH eating plan is a healthy eating plan that has been shown to reduce high blood pressure (hypertension). It may also reduce your risk for type 2 diabetes, heart disease, and stroke. The DASH eating plan may also help with weight loss. What are tips for following this plan?  General guidelines  Avoid eating more than 2,300 mg (milligrams) of salt (sodium) a day. If you have hypertension, you may need to reduce your sodium intake to 1,500 mg a day.  Limit alcohol intake to no more than 1 drink a day for nonpregnant women and 2 drinks a day for men. One drink equals 12 oz of beer, 5 oz of wine, or 1 oz of hard liquor.  Work with your health care provider to maintain a healthy body weight or to lose weight. Ask what an ideal weight is for you.  Get at least 30 minutes of exercise that causes your heart to beat faster (aerobic exercise) most days of the week. Activities may include walking, swimming, or biking.  Work with your health care provider or diet and nutrition specialist (dietitian) to adjust your eating plan to your individual calorie needs. Reading food labels   Check food labels for the amount of sodium per serving. Choose foods with less than 5 percent of the Daily Value of sodium. Generally, foods with less than 300 mg of sodium per serving fit into this eating plan.  To find whole grains, look for the word "whole" as the first word in the ingredient list. Shopping  Buy products labeled as "low-sodium" or "no salt added."  Buy fresh foods. Avoid canned foods and premade or frozen meals. Cooking  Avoid adding salt when cooking. Use salt-free seasonings or herbs instead of table salt or sea salt. Check with your health care provider or pharmacist before using salt substitutes.  Do not fry foods. Cook foods using healthy methods such as baking, boiling, grilling, and broiling instead.  Cook with  heart-healthy oils, such as olive, canola, soybean, or sunflower oil. Meal planning  Eat a balanced diet that includes: ? 5 or more servings of fruits and vegetables each day. At each meal, try to fill half of your plate with fruits and vegetables. ? Up to 6-8 servings of whole grains each day. ? Less than 6 oz of lean meat, poultry, or fish each day. A 3-oz serving of meat is about the same size as a deck of cards. One egg equals 1 oz. ? 2 servings of low-fat dairy each day. ? A serving of nuts, seeds, or beans 5 times each week. ? Heart-healthy fats. Healthy fats called Omega-3 fatty acids are found in foods such as flaxseeds and coldwater fish, like sardines, salmon, and mackerel.  Limit how much you eat of the following: ? Canned or prepackaged foods. ? Food that is high in trans fat, such as fried foods. ? Food that is high in saturated fat, such as fatty meat. ? Sweets, desserts, sugary drinks, and other foods with added sugar. ? Full-fat dairy products.  Do not salt foods before eating.  Try to eat at least 2 vegetarian meals each week.  Eat more home-cooked food and less restaurant, buffet, and fast food.  When eating at a restaurant, ask that your food be prepared with less salt or no salt, if possible. What foods are recommended? The items listed may not be a complete list. Talk with your dietitian about   what dietary choices are best for you. Grains Whole-grain or whole-wheat bread. Whole-grain or whole-wheat pasta. Brown rice. Oatmeal. Quinoa. Bulgur. Whole-grain and low-sodium cereals. Pita bread. Low-fat, low-sodium crackers. Whole-wheat flour tortillas. Vegetables Fresh or frozen vegetables (raw, steamed, roasted, or grilled). Low-sodium or reduced-sodium tomato and vegetable juice. Low-sodium or reduced-sodium tomato sauce and tomato paste. Low-sodium or reduced-sodium canned vegetables. Fruits All fresh, dried, or frozen fruit. Canned fruit in natural juice (without  added sugar). Meat and other protein foods Skinless chicken or turkey. Ground chicken or turkey. Pork with fat trimmed off. Fish and seafood. Egg whites. Dried beans, peas, or lentils. Unsalted nuts, nut butters, and seeds. Unsalted canned beans. Lean cuts of beef with fat trimmed off. Low-sodium, lean deli meat. Dairy Low-fat (1%) or fat-free (skim) milk. Fat-free, low-fat, or reduced-fat cheeses. Nonfat, low-sodium ricotta or cottage cheese. Low-fat or nonfat yogurt. Low-fat, low-sodium cheese. Fats and oils Soft margarine without trans fats. Vegetable oil. Low-fat, reduced-fat, or light mayonnaise and salad dressings (reduced-sodium). Canola, safflower, olive, soybean, and sunflower oils. Avocado. Seasoning and other foods Herbs. Spices. Seasoning mixes without salt. Unsalted popcorn and pretzels. Fat-free sweets. What foods are not recommended? The items listed may not be a complete list. Talk with your dietitian about what dietary choices are best for you. Grains Baked goods made with fat, such as croissants, muffins, or some breads. Dry pasta or rice meal packs. Vegetables Creamed or fried vegetables. Vegetables in a cheese sauce. Regular canned vegetables (not low-sodium or reduced-sodium). Regular canned tomato sauce and paste (not low-sodium or reduced-sodium). Regular tomato and vegetable juice (not low-sodium or reduced-sodium). Pickles. Olives. Fruits Canned fruit in a light or heavy syrup. Fried fruit. Fruit in cream or butter sauce. Meat and other protein foods Fatty cuts of meat. Ribs. Fried meat. Bacon. Sausage. Bologna and other processed lunch meats. Salami. Fatback. Hotdogs. Bratwurst. Salted nuts and seeds. Canned beans with added salt. Canned or smoked fish. Whole eggs or egg yolks. Chicken or turkey with skin. Dairy Whole or 2% milk, cream, and half-and-half. Whole or full-fat cream cheese. Whole-fat or sweetened yogurt. Full-fat cheese. Nondairy creamers. Whipped toppings.  Processed cheese and cheese spreads. Fats and oils Butter. Stick margarine. Lard. Shortening. Ghee. Bacon fat. Tropical oils, such as coconut, palm kernel, or palm oil. Seasoning and other foods Salted popcorn and pretzels. Onion salt, garlic salt, seasoned salt, table salt, and sea salt. Worcestershire sauce. Tartar sauce. Barbecue sauce. Teriyaki sauce. Soy sauce, including reduced-sodium. Steak sauce. Canned and packaged gravies. Fish sauce. Oyster sauce. Cocktail sauce. Horseradish that you find on the shelf. Ketchup. Mustard. Meat flavorings and tenderizers. Bouillon cubes. Hot sauce and Tabasco sauce. Premade or packaged marinades. Premade or packaged taco seasonings. Relishes. Regular salad dressings. Where to find more information:  National Heart, Lung, and Blood Institute: www.nhlbi.nih.gov  American Heart Association: www.heart.org Summary  The DASH eating plan is a healthy eating plan that has been shown to reduce high blood pressure (hypertension). It may also reduce your risk for type 2 diabetes, heart disease, and stroke.  With the DASH eating plan, you should limit salt (sodium) intake to 2,300 mg a day. If you have hypertension, you may need to reduce your sodium intake to 1,500 mg a day.  When on the DASH eating plan, aim to eat more fresh fruits and vegetables, whole grains, lean proteins, low-fat dairy, and heart-healthy fats.  Work with your health care provider or diet and nutrition specialist (dietitian) to adjust your eating plan to your   individual calorie needs. This information is not intended to replace advice given to you by your health care provider. Make sure you discuss any questions you have with your health care provider. Document Revised: 09/14/2017 Document Reviewed: 09/25/2016 Elsevier Patient Education  2020 Elsevier Inc.  

## 2020-04-28 ENCOUNTER — Other Ambulatory Visit: Payer: Self-pay | Admitting: Family Medicine

## 2020-04-28 DIAGNOSIS — E785 Hyperlipidemia, unspecified: Secondary | ICD-10-CM

## 2020-04-28 DIAGNOSIS — I10 Essential (primary) hypertension: Secondary | ICD-10-CM

## 2020-04-28 DIAGNOSIS — E1169 Type 2 diabetes mellitus with other specified complication: Secondary | ICD-10-CM

## 2020-04-28 DIAGNOSIS — E782 Mixed hyperlipidemia: Secondary | ICD-10-CM

## 2020-04-28 MED ORDER — ATORVASTATIN CALCIUM 80 MG PO TABS: 80.0000 mg | ORAL_TABLET | Freq: Every day | ORAL | 2 refills | Status: DC

## 2020-04-29 LAB — COMPLIANCE DRUG ANALYSIS, UR

## 2020-04-30 LAB — CMP14+EGFR
ALT: 21 IU/L (ref 0–44)
AST: 21 IU/L (ref 0–40)
Albumin/Globulin Ratio: 1.6 (ref 1.2–2.2)
Albumin: 4.7 g/dL (ref 3.8–4.9)
Alkaline Phosphatase: 93 IU/L (ref 48–121)
BUN/Creatinine Ratio: 13 (ref 9–20)
BUN: 20 mg/dL (ref 6–24)
Bilirubin Total: 1.2 mg/dL (ref 0.0–1.2)
CO2: 24 mmol/L (ref 20–29)
Calcium: 9.5 mg/dL (ref 8.7–10.2)
Chloride: 100 mmol/L (ref 96–106)
Creatinine, Ser: 1.59 mg/dL — ABNORMAL HIGH (ref 0.76–1.27)
GFR calc Af Amer: 57 mL/min/{1.73_m2} — ABNORMAL LOW (ref >59)
GFR calc non Af Amer: 49 mL/min/{1.73_m2} — ABNORMAL LOW (ref >59)
Globulin, Total: 3 g/dL (ref 1.5–4.5)
Glucose: 138 mg/dL — ABNORMAL HIGH (ref 65–99)
Potassium: 3.9 mmol/L (ref 3.5–5.2)
Sodium: 138 mmol/L (ref 134–144)
Total Protein: 7.7 g/dL (ref 6.0–8.5)

## 2020-04-30 LAB — CBC WITH DIFFERENTIAL/PLATELET
Basophils Absolute: 0 10*3/uL (ref 0.0–0.2)
Basos: 1 %
EOS (ABSOLUTE): 0.1 10*3/uL (ref 0.0–0.4)
Eos: 5 %
Hematocrit: 47.4 % (ref 37.5–51.0)
Hemoglobin: 15.2 g/dL (ref 13.0–17.7)
Immature Grans (Abs): 0 10*3/uL (ref 0.0–0.1)
Immature Granulocytes: 0 %
Lymphocytes Absolute: 1 10*3/uL (ref 0.7–3.1)
Lymphs: 33 %
MCH: 26.1 pg — ABNORMAL LOW (ref 26.6–33.0)
MCHC: 32.1 g/dL (ref 31.5–35.7)
MCV: 81 fL (ref 79–97)
Monocytes Absolute: 0.3 10*3/uL (ref 0.1–0.9)
Monocytes: 10 %
Neutrophils Absolute: 1.5 10*3/uL (ref 1.4–7.0)
Neutrophils: 51 %
Platelets: 294 10*3/uL (ref 150–450)
RBC: 5.83 x10E6/uL — ABNORMAL HIGH (ref 4.14–5.80)
RDW: 14.5 % (ref 11.6–15.4)
WBC: 3 10*3/uL — ABNORMAL LOW (ref 3.4–10.8)

## 2020-04-30 LAB — LIPID PANEL
Chol/HDL Ratio: 3.8 ratio (ref 0.0–5.0)
Cholesterol, Total: 195 mg/dL (ref 100–199)
HDL: 51 mg/dL (ref >39)
LDL Chol Calc (NIH): 125 mg/dL — ABNORMAL HIGH (ref 0–99)
Triglycerides: 106 mg/dL (ref 0–149)
VLDL Cholesterol Cal: 19 mg/dL (ref 5–40)

## 2020-04-30 LAB — TESTOSTERONE,FREE AND TOTAL
Testosterone, Free: 6.4 pg/mL — ABNORMAL LOW (ref 7.2–24.0)
Testosterone: 151 ng/dL — ABNORMAL LOW (ref 264–916)

## 2020-05-23 ENCOUNTER — Other Ambulatory Visit: Payer: Self-pay | Admitting: Family Medicine

## 2020-06-23 ENCOUNTER — Other Ambulatory Visit: Payer: Self-pay | Admitting: Family Medicine

## 2020-06-23 DIAGNOSIS — F5101 Primary insomnia: Secondary | ICD-10-CM

## 2020-08-26 ENCOUNTER — Other Ambulatory Visit: Payer: Self-pay | Admitting: Family Medicine

## 2020-08-26 DIAGNOSIS — F5101 Primary insomnia: Secondary | ICD-10-CM

## 2020-09-06 ENCOUNTER — Encounter: Payer: Self-pay | Admitting: Nurse Practitioner

## 2020-09-06 ENCOUNTER — Ambulatory Visit: Payer: Commercial Managed Care - PPO | Admitting: Nurse Practitioner

## 2020-09-06 ENCOUNTER — Other Ambulatory Visit: Payer: Self-pay

## 2020-09-06 VITALS — BP 170/99 | HR 85 | Temp 97.7°F | Ht 70.0 in | Wt 216.0 lb

## 2020-09-06 DIAGNOSIS — E785 Hyperlipidemia, unspecified: Secondary | ICD-10-CM

## 2020-09-06 DIAGNOSIS — E291 Testicular hypofunction: Secondary | ICD-10-CM

## 2020-09-06 DIAGNOSIS — F5101 Primary insomnia: Secondary | ICD-10-CM

## 2020-09-06 DIAGNOSIS — E1169 Type 2 diabetes mellitus with other specified complication: Secondary | ICD-10-CM | POA: Diagnosis not present

## 2020-09-06 DIAGNOSIS — I10 Essential (primary) hypertension: Secondary | ICD-10-CM | POA: Diagnosis not present

## 2020-09-06 DIAGNOSIS — N529 Male erectile dysfunction, unspecified: Secondary | ICD-10-CM | POA: Diagnosis not present

## 2020-09-06 LAB — BAYER DCA HB A1C WAIVED: HB A1C (BAYER DCA - WAIVED): 7.2 % — ABNORMAL HIGH (ref <7.0)

## 2020-09-06 MED ORDER — LOSARTAN POTASSIUM-HCTZ 100-25 MG PO TABS: 1.0000 | ORAL_TABLET | Freq: Every day | ORAL | 1 refills | Status: DC

## 2020-09-06 MED ORDER — GLIPIZIDE ER 5 MG PO TB24: 5.0000 mg | ORAL_TABLET | Freq: Every day | ORAL | 2 refills | Status: DC

## 2020-09-06 MED ORDER — LOSARTAN POTASSIUM-HCTZ 100-25 MG PO TABS: 1.0000 | ORAL_TABLET | Freq: Every day | ORAL | 2 refills | Status: DC

## 2020-09-06 MED ORDER — GLIPIZIDE ER 5 MG PO TB24: 5.0000 mg | ORAL_TABLET | Freq: Every day | ORAL | 1 refills | Status: DC

## 2020-09-06 MED ORDER — TESTOSTERONE CYPIONATE 200 MG/ML IM SOLN: 200.0000 mg | INTRAMUSCULAR | 4 refills | Status: DC

## 2020-09-06 MED ORDER — TRAZODONE HCL 50 MG PO TABS: 50.0000 mg | ORAL_TABLET | Freq: Every evening | ORAL | 0 refills | Status: DC | PRN

## 2020-09-06 NOTE — Progress Notes (Signed)
Established Patient Office Visit  Subjective:  Patient ID: Julian CootsSpencer B Scott, male    DOB: August 28, 1966  Age: 54 y.o. MRN: [id number]  CC:  Chief Complaint  Patient presents with   Medical Management of Chronic Issues    HPI Julian CootsSpencer B Scott presents for Pt presents for follow up of hypertension. /Patient was diagnosed in 05/11/2014.The patient is tolerating the medication well /without side effects. Compliance with treatment has been good; including taking medication as directed , maintains a healthy diet and regular exercise regimen , and following up as directed.  Current medication amlodipine 10 mg tablet by mouth daily, metoprolol 25 mg tablet by mouth daily.  The patient presents with history of type 2 diabetes mellitus without complications. Patient was diagnosed in 02/01/2017.  Compliance with treatment has been good; the patient takes medication as directed , maintains a diabetic diet and an exercise regimen , follows up as directed , and is keeping a glucose diary.  Patient did not bring glucose readings to visit.  Patient specifically denies associated symptoms, including blurred vision, fatigue, polydipsia, polyphagia and polyuria . Patient denies hypoglycemia. In regard to preventative care, the patient performs foot self-exams daily and last ophthalmology exam was in: Patient will schedule.  Insomnia:  Patient is reporting worsening insomnia due to being out of trazodone in the last month.  Patient is reporting even with the trazodone he is only getting 4 to 5 hours of sleep every night.  Patient would like to consider a different sleep medication.  Past Medical History:  Diagnosis Date   Diabetes mellitus (HCC)    Family history of prostate cancer in father 12/16/2015   Hyperlipidemia    Hypertension    Primary insomnia 02/01/2017   Testosterone deficiency     Past Surgical History:  Procedure Laterality Date   Cyst Removal Back     CYST REMOVAL HAND       Family History  Problem Relation Age of Onset   Hypertension Mother    Diabetes Mother    Parkinson's disease Father    Dementia Father    Prostate cancer Father    Hypertension Brother    Diabetes Maternal Grandmother    Heart attack Maternal Grandfather    Dementia Paternal Grandmother    Lung disease Paternal Grandfather    Colon cancer Neg Hx    Esophageal cancer Neg Hx    Rectal cancer Neg Hx    Stomach cancer Neg Hx    Colon polyps Neg Hx     Social History   Socioeconomic History   Marital status: Married    Spouse name: Not on file   Number of children: Not on file   Years of education: Not on file   Highest education level: Not on file  Occupational History   Not on file  Tobacco Use   Smoking status: Never Smoker   Smokeless tobacco: Never Used  Vaping Use   Vaping Use: Never used  Substance and Sexual Activity   Alcohol use: Not Currently    Comment: Every blue moon - socially   Drug use: Never   Sexual activity: Not on file  Other Topics Concern   Not on file  Social History Narrative   Not on file   Social Determinants of Health   Financial Resource Strain:    Difficulty of Paying Living Expenses: Not on file  Food Insecurity:    Worried About Running Out of Food in the Last Year: Not on file  Ran Out of Food in the Last Year: Not on file  Transportation Needs:    Lack of Transportation (Medical): Not on file   Lack of Transportation (Non-Medical): Not on file  Physical Activity:    Days of Exercise per Week: Not on file   Minutes of Exercise per Session: Not on file  Stress:    Feeling of Stress : Not on file  Social Connections:    Frequency of Communication with Friends and Family: Not on file   Frequency of Social Gatherings with Friends and Family: Not on file   Attends Religious Services: Not on file   Active Member of Clubs or Organizations: Not on file   Attends Banker  Meetings: Not on file   Marital Status: Not on file  Intimate Partner Violence:    Fear of Current or Ex-Partner: Not on file   Emotionally Abused: Not on file   Physically Abused: Not on file   Sexually Abused: Not on file    Outpatient Medications Prior to Visit  Medication Sig Dispense Refill   amLODipine (NORVASC) 10 MG tablet Take 1 tablet (10 mg total) by mouth daily. 90 tablet 1   atorvastatin (LIPITOR) 80 MG tablet Take 1 tablet (80 mg total) by mouth daily at 6 PM. 30 tablet 2   glipiZIDE (GLUCOTROL XL) 5 MG 24 hr tablet Take 1 tablet (5 mg total) by mouth daily with breakfast. 90 tablet 1   losartan-hydrochlorothiazide (HYZAAR) 100-25 MG tablet Take 1 tablet by mouth daily. 90 tablet 1   metoprolol succinate (TOPROL XL) 25 MG 24 hr tablet Take 1 tablet (25 mg total) by mouth daily. 30 tablet 2   predniSONE (DELTASONE) 5 MG tablet Take by mouth.     sildenafil (VIAGRA) 100 MG tablet Take 0.5-1 tablets (50-100 mg total) by mouth daily as needed for erectile dysfunction. 30 tablet 0   tamsulosin (FLOMAX) 0.4 MG CAPS capsule Take 1 capsule (0.4 mg total) by mouth daily after supper. 90 capsule 1   temazepam (RESTORIL) 30 MG capsule Take 1 capsule (30 mg total) by mouth at bedtime as needed for sleep. 30 capsule 2   testosterone cypionate (DEPOTESTOSTERONE CYPIONATE) 200 MG/ML injection INJECT 1 ML (200 MG TOTAL) INTO THE MUSCLE EVERY 14 (FOURTEEN) DAYS. 2 mL 4   No facility-administered medications prior to visit.    No Known Allergies  ROS Review of Systems  Neurological: Negative for light-headedness, numbness and headaches.  Psychiatric/Behavioral: Positive for sleep disturbance. Negative for self-injury and suicidal ideas. The patient is not nervous/anxious.        PHQ-9 score 5  All other systems reviewed and are negative.     Objective:    Physical Exam Vitals reviewed.  HENT:     Head: Normocephalic.     Nose: Nose normal.  Cardiovascular:      Rate and Rhythm: Normal rate and regular rhythm.     Pulses: Normal pulses.     Heart sounds: Normal heart sounds.  Pulmonary:     Effort: Pulmonary effort is normal.     Breath sounds: Normal breath sounds.  Abdominal:     General: Bowel sounds are normal.  Musculoskeletal:        General: No swelling or tenderness.     Cervical back: Normal range of motion.  Skin:    General: Skin is warm.  Neurological:     Mental Status: He is alert and oriented to person, place, and time.  Psychiatric:  Mood and Affect: Mood normal.     Comments: Insomnia     BP (!) 170/99    Pulse 85    Temp 97.7 F (36.5 C) (Temporal)    Ht 5\' 10"  (1.778 m)    Wt 216 lb (98 kg)    SpO2 97%    BMI 30.99 kg/m  Wt Readings from Last 3 Encounters:  09/06/20 216 lb (98 kg)  04/27/20 214 lb 6.4 oz (97.3 kg)  12/22/19 222 lb (100.7 kg)     Health Maintenance Due  Topic Date Due   HIV Screening  Never done    There are no preventive care reminders to display for this patient.  No results found for: TSH Lab Results  Component Value Date   WBC 3.0 (L) 04/27/2020   HGB 15.2 04/27/2020   HCT 47.4 04/27/2020   MCV 81 04/27/2020   PLT 294 04/27/2020   Lab Results  Component Value Date   NA 138 04/27/2020   K 3.9 04/27/2020   CO2 24 04/27/2020   GLUCOSE 138 (H) 04/27/2020   BUN 20 04/27/2020   CREATININE 1.59 (H) 04/27/2020   BILITOT 1.2 04/27/2020   ALKPHOS 93 04/27/2020   AST 21 04/27/2020   ALT 21 04/27/2020   PROT 7.7 04/27/2020   ALBUMIN 4.7 04/27/2020   CALCIUM 9.5 04/27/2020   Lab Results  Component Value Date   CHOL 195 04/27/2020   Lab Results  Component Value Date   HDL 51 04/27/2020   Lab Results  Component Value Date   LDLCALC 125 (H) 04/27/2020   Lab Results  Component Value Date   TRIG 106 04/27/2020   Lab Results  Component Value Date   CHOLHDL 3.8 04/27/2020   Lab Results  Component Value Date   HGBA1C 7.2 (H) 09/06/2020      Assessment & Plan:    Problem List Items Addressed This Visit      Cardiovascular and Mediastinum   Uncontrolled hypertension    Hypertension well controlled on current medication no changes necessary.  Blood pressure elevated in clinic today.  Patient is reporting he has not had his blood pressure medication for about a month because he ran out.  Provided education to patient with printed handouts given. Rx refill sent to pharmacy.  Advised patient to check blood pressures at home daily and follow-up with worsening or unresolved blood pressures.      Relevant Medications   losartan-hydrochlorothiazide (HYZAAR) 100-25 MG tablet     Endocrine   DM type 2 with diabetic dyslipidemia (HCC) - Primary    Diabetes well controlled on glipizide 5 mg tablet daily.  Patient is not checking blood sugars at home consistently.  Patient is not reporting any symptoms of hyper or hypoglycemia.  Completed A1c.  Results pending.  Provided education to patient to continue to check blood sugars at home and report any signs and symptoms of hyper or hypoglycemia.  Patient verbalized understanding.  Rx refill sent to pharmacy  Follow-up in 3 months.      Relevant Medications   glipiZIDE (GLUCOTROL XL) 5 MG 24 hr tablet   losartan-hydrochlorothiazide (HYZAAR) 100-25 MG tablet   Other Relevant Orders   Bayer DCA Hb A1c Waived (Completed)   CBC with Differential   Comprehensive metabolic panel   Lipid Panel   Hypogonadism in male   Relevant Medications   testosterone cypionate (DEPOTESTOSTERONE CYPIONATE) 200 MG/ML injection     Other   Erectile dysfunction    Testosterone  labs completed results pending. Rx refill sent to pharmacy.         Relevant Orders   Testosterone   Primary insomnia   Relevant Medications   traZODone (DESYREL) 50 MG tablet      Meds ordered this encounter  Medications   DISCONTD: glipiZIDE (GLUCOTROL XL) 5 MG 24 hr tablet    Sig: Take 1 tablet (5 mg total) by mouth daily with  breakfast.    Dispense:  90 tablet    Refill:  1    Order Specific Question:   Supervising Provider    Answer:   Arville Care A [[id number]]   DISCONTD: losartan-hydrochlorothiazide (HYZAAR) 100-25 MG tablet    Sig: Take 1 tablet by mouth daily.    Dispense:  90 tablet    Refill:  1    Order Specific Question:   Supervising Provider    Answer:   Arville Care A [[id number]]   testosterone cypionate (DEPOTESTOSTERONE CYPIONATE) 200 MG/ML injection    Sig: Inject 1 mL (200 mg total) into the muscle every 14 (fourteen) days.    Dispense:  2 mL    Refill:  4    Not to exceed 1 additional fills before 05/22/2020    Order Specific Question:   Supervising Provider    Answer:   Arville Care A [[id number]]   traZODone (DESYREL) 50 MG tablet    Sig: Take 1 tablet (50 mg total) by mouth at bedtime as needed for sleep.    Dispense:  30 tablet    Refill:  0    Order Specific Question:   Supervising Provider    Answer:   Arville Care A [[id number]]   glipiZIDE (GLUCOTROL XL) 5 MG 24 hr tablet    Sig: Take 1 tablet (5 mg total) by mouth daily with breakfast.    Dispense:  30 tablet    Refill:  2    Order Specific Question:   Supervising Provider    Answer:   Arville Care A [[id number]]   losartan-hydrochlorothiazide (HYZAAR) 100-25 MG tablet    Sig: Take 1 tablet by mouth daily.    Dispense:  30 tablet    Refill:  2    Order Specific Question:   Supervising Provider    Answer:   Arville Care A [id number]    Follow-up: Return in about 3 months (around 12/07/2020).    Daryll Drown, NP

## 2020-09-06 NOTE — Assessment & Plan Note (Signed)
Diabetes well controlled on glipizide 5 mg tablet daily.  Patient is not checking blood sugars at home consistently.  Patient is not reporting any symptoms of hyper or hypoglycemia.  Completed A1c.  Results pending.  Provided education to patient to continue to check blood sugars at home and report any signs and symptoms of hyper or hypoglycemia.  Patient verbalized understanding.  Rx refill sent to pharmacy  Follow-up in 3 months.

## 2020-09-06 NOTE — Assessment & Plan Note (Signed)
Hypertension well controlled on current medication no changes necessary.  Blood pressure elevated in clinic today.  Patient is reporting he has not had his blood pressure medication for about a month because he ran out.  Provided education to patient with printed handouts given. Rx refill sent to pharmacy.  Advised patient to check blood pressures at home daily and follow-up with worsening or unresolved blood pressures.

## 2020-09-06 NOTE — Patient Instructions (Signed)

## 2020-09-06 NOTE — Assessment & Plan Note (Signed)
Testosterone labs completed results pending. Rx refill sent to pharmacy.

## 2020-09-07 LAB — COMPREHENSIVE METABOLIC PANEL
ALT: 37 IU/L (ref 0–44)
AST: 22 IU/L (ref 0–40)
Albumin/Globulin Ratio: 1.4 (ref 1.2–2.2)
Albumin: 4.4 g/dL (ref 3.8–4.9)
Alkaline Phosphatase: 106 IU/L (ref 44–121)
BUN/Creatinine Ratio: 15 (ref 9–20)
BUN: 23 mg/dL (ref 6–24)
Bilirubin Total: 1.2 mg/dL (ref 0.0–1.2)
CO2: 28 mmol/L (ref 20–29)
Calcium: 9.6 mg/dL (ref 8.7–10.2)
Chloride: 99 mmol/L (ref 96–106)
Creatinine, Ser: 1.5 mg/dL — ABNORMAL HIGH (ref 0.76–1.27)
GFR calc Af Amer: 61 mL/min/{1.73_m2} (ref >59)
GFR calc non Af Amer: 53 mL/min/{1.73_m2} — ABNORMAL LOW (ref >59)
Globulin, Total: 3.1 g/dL (ref 1.5–4.5)
Glucose: 124 mg/dL — ABNORMAL HIGH (ref 65–99)
Potassium: 4.8 mmol/L (ref 3.5–5.2)
Sodium: 139 mmol/L (ref 134–144)
Total Protein: 7.5 g/dL (ref 6.0–8.5)

## 2020-09-07 LAB — LIPID PANEL
Chol/HDL Ratio: 5.2 ratio — ABNORMAL HIGH (ref 0.0–5.0)
Cholesterol, Total: 229 mg/dL — ABNORMAL HIGH (ref 100–199)
HDL: 44 mg/dL (ref >39)
LDL Chol Calc (NIH): 137 mg/dL — ABNORMAL HIGH (ref 0–99)
Triglycerides: 268 mg/dL — ABNORMAL HIGH (ref 0–149)
VLDL Cholesterol Cal: 48 mg/dL — ABNORMAL HIGH (ref 5–40)

## 2020-09-07 LAB — CBC WITH DIFFERENTIAL/PLATELET
Basophils Absolute: 0 10*3/uL (ref 0.0–0.2)
Basos: 0 %
EOS (ABSOLUTE): 0.1 10*3/uL (ref 0.0–0.4)
Eos: 2 %
Hematocrit: 46.4 % (ref 37.5–51.0)
Hemoglobin: 15.3 g/dL (ref 13.0–17.7)
Immature Grans (Abs): 0 10*3/uL (ref 0.0–0.1)
Immature Granulocytes: 0 %
Lymphocytes Absolute: 0.9 10*3/uL (ref 0.7–3.1)
Lymphs: 25 %
MCH: 27.5 pg (ref 26.6–33.0)
MCHC: 33 g/dL (ref 31.5–35.7)
MCV: 83 fL (ref 79–97)
Monocytes Absolute: 0.3 10*3/uL (ref 0.1–0.9)
Monocytes: 8 %
Neutrophils Absolute: 2.3 10*3/uL (ref 1.4–7.0)
Neutrophils: 65 %
Platelets: 333 10*3/uL (ref 150–450)
RBC: 5.57 x10E6/uL (ref 4.14–5.80)
RDW: 13.3 % (ref 11.6–15.4)
WBC: 3.6 10*3/uL (ref 3.4–10.8)

## 2020-09-12 ENCOUNTER — Other Ambulatory Visit: Payer: Self-pay | Admitting: Nurse Practitioner

## 2020-09-12 MED ORDER — GLIPIZIDE 10 MG PO TABS: 10.0000 mg | ORAL_TABLET | Freq: Two times a day (BID) | ORAL | 3 refills | Status: DC

## 2020-09-12 MED ORDER — OMEGA-3 FATTY ACIDS 1000 MG PO CAPS: 2.0000 g | ORAL_CAPSULE | Freq: Two times a day (BID) | ORAL | 2 refills | Status: DC

## 2020-09-27 ENCOUNTER — Other Ambulatory Visit: Payer: Self-pay

## 2020-09-27 DIAGNOSIS — F5101 Primary insomnia: Secondary | ICD-10-CM

## 2020-09-27 MED ORDER — TRAZODONE HCL 50 MG PO TABS: 50.0000 mg | ORAL_TABLET | Freq: Every evening | ORAL | 0 refills | Status: DC | PRN

## 2020-09-27 NOTE — Telephone Encounter (Signed)
Last seen by you for chronic follow up on 09/06/2020.

## 2020-10-04 ENCOUNTER — Other Ambulatory Visit: Payer: Self-pay | Admitting: *Deleted

## 2020-10-04 MED ORDER — OMEGA-3 FATTY ACIDS 1000 MG PO CAPS: ORAL_CAPSULE | ORAL | 5 refills | Status: DC

## 2020-10-04 MED ORDER — OMEGA-3 FATTY ACIDS 1000 MG PO CAPS: 2.0000 g | ORAL_CAPSULE | Freq: Two times a day (BID) | ORAL | 5 refills | Status: DC

## 2020-10-04 NOTE — Addendum Note (Signed)
Addended by: Julious Payer D on: 10/04/2020 03:47 PM   Modules accepted: Orders

## 2020-11-11 ENCOUNTER — Other Ambulatory Visit: Payer: Self-pay | Admitting: *Deleted

## 2020-11-11 MED ORDER — OMEGA-3 FATTY ACIDS 1000 MG PO CAPS: 2.0000 g | ORAL_CAPSULE | ORAL | 3 refills | Status: DC

## 2020-11-11 MED ORDER — OMEGA-3 FATTY ACIDS 1000 MG PO CAPS: ORAL_CAPSULE | ORAL | 0 refills | Status: DC

## 2020-11-11 MED ORDER — OMEGA-3 FATTY ACIDS 1000 MG PO CAPS: 2.0000 g | ORAL_CAPSULE | ORAL | 0 refills | Status: DC

## 2020-11-11 NOTE — Addendum Note (Signed)
Addended by: Julious Payer D on: 11/11/2020 02:28 PM   Modules accepted: Orders

## 2020-11-22 ENCOUNTER — Other Ambulatory Visit: Payer: Self-pay | Admitting: *Deleted

## 2020-11-22 DIAGNOSIS — F5101 Primary insomnia: Secondary | ICD-10-CM

## 2020-11-22 MED ORDER — TRAZODONE HCL 50 MG PO TABS
50.0000 mg | ORAL_TABLET | Freq: Every evening | ORAL | 2 refills | Status: DC | PRN
Start: 2020-11-22 — End: 2021-03-22

## 2020-12-06 ENCOUNTER — Other Ambulatory Visit: Payer: Self-pay | Admitting: *Deleted

## 2020-12-06 MED ORDER — GLIPIZIDE 10 MG PO TABS: 10.0000 mg | ORAL_TABLET | Freq: Two times a day (BID) | ORAL | 0 refills | Status: DC

## 2021-02-21 ENCOUNTER — Other Ambulatory Visit: Payer: Self-pay | Admitting: *Deleted

## 2021-02-21 DIAGNOSIS — E291 Testicular hypofunction: Secondary | ICD-10-CM

## 2021-02-22 MED ORDER — TESTOSTERONE CYPIONATE 200 MG/ML IM SOLN: 200.0000 mg | INTRAMUSCULAR | 0 refills | Status: DC

## 2021-02-22 NOTE — Telephone Encounter (Signed)
Please call patient to schedule an appointment.

## 2021-03-22 ENCOUNTER — Other Ambulatory Visit: Payer: Self-pay | Admitting: Family Medicine

## 2021-03-22 DIAGNOSIS — F5101 Primary insomnia: Secondary | ICD-10-CM

## 2021-03-29 ENCOUNTER — Ambulatory Visit: Payer: Commercial Managed Care - PPO | Admitting: Urology

## 2021-03-29 ENCOUNTER — Other Ambulatory Visit (INDEPENDENT_AMBULATORY_CARE_PROVIDER_SITE_OTHER): Payer: Self-pay | Admitting: Internal Medicine

## 2021-03-29 ENCOUNTER — Other Ambulatory Visit: Payer: Self-pay

## 2021-03-29 ENCOUNTER — Ambulatory Visit (INDEPENDENT_AMBULATORY_CARE_PROVIDER_SITE_OTHER): Payer: Commercial Managed Care - PPO | Admitting: Internal Medicine

## 2021-03-29 ENCOUNTER — Encounter (INDEPENDENT_AMBULATORY_CARE_PROVIDER_SITE_OTHER): Payer: Self-pay | Admitting: Internal Medicine

## 2021-03-29 VITALS — BP 150/100 | HR 102 | Temp 97.3°F | Ht 68.75 in | Wt 217.8 lb

## 2021-03-29 DIAGNOSIS — N200 Calculus of kidney: Secondary | ICD-10-CM

## 2021-03-29 DIAGNOSIS — E559 Vitamin D deficiency, unspecified: Secondary | ICD-10-CM

## 2021-03-29 DIAGNOSIS — E291 Testicular hypofunction: Secondary | ICD-10-CM

## 2021-03-29 DIAGNOSIS — Z125 Encounter for screening for malignant neoplasm of prostate: Secondary | ICD-10-CM

## 2021-03-29 DIAGNOSIS — E1169 Type 2 diabetes mellitus with other specified complication: Secondary | ICD-10-CM

## 2021-03-29 DIAGNOSIS — I10 Essential (primary) hypertension: Secondary | ICD-10-CM | POA: Diagnosis not present

## 2021-03-29 DIAGNOSIS — N1831 Chronic kidney disease, stage 3a: Secondary | ICD-10-CM

## 2021-03-29 DIAGNOSIS — F5101 Primary insomnia: Secondary | ICD-10-CM | POA: Diagnosis not present

## 2021-03-29 DIAGNOSIS — E785 Hyperlipidemia, unspecified: Secondary | ICD-10-CM

## 2021-03-29 MED ORDER — RYBELSUS 3 MG PO TABS: 3.0000 mg | ORAL_TABLET | Freq: Every day | ORAL | 3 refills | Status: DC

## 2021-03-29 MED ORDER — HYDRALAZINE HCL 25 MG PO TABS: 25.0000 mg | ORAL_TABLET | Freq: Three times a day (TID) | ORAL | 3 refills | Status: DC

## 2021-03-29 NOTE — Patient Instructions (Signed)
[facility] Dietary Recommendations for Weight Loss What to Avoid Avoid added sugars Often added sugar can be found in processed foods such as many condiments, dry cereals, cakes, cookies, chips, crisps, crackers, candies, sweetened drinks, etc.  Read labels and AVOID/DECREASE use of foods with the following in their ingredient list: Sugar, fructose, high fructose corn syrup, sucrose, glucose, maltose, dextrose, molasses, cane sugar, brown sugar, any type of syrup, agave nectar, etc.   Avoid snacking in between meals Avoid foods made with flour If you are going to eat food made with flour, choose those made with whole-grains; and, minimize your consumption as much as is tolerable Avoid processed foods These foods are generally stocked in the middle of the grocery store. Focus on shopping on the perimeter of the grocery.  Avoid Meat  We recommend following a plant-based diet at [facility]. Thus, we recommend avoiding meat as a general rule. Consider eating beans, legumes, eggs, and/or dairy products for regular protein sources If you plan on eating meat limit to 4 ounces of meat at a time and choose lean options such as Fish, chicken, turkey. Avoid red meat intake such as pork and/or steak What to Include Vegetables GREEN LEAFY VEGETABLES: Kale, spinach, mustard greens, collard greens, cabbage, broccoli, etc. OTHER: Asparagus, cauliflower, eggplant, carrots, peas, Brussel sprouts, tomatoes, bell peppers, zucchini, beets, cucumbers, etc. Grains, seeds, and legumes Beans: kidney beans, black eyed peas, garbanzo beans, black beans, pinto beans, etc. Whole, unrefined grains: brown rice, barley, bulgur, oatmeal, etc. Healthy fats  Avoid highly processed fats such as vegetable oil Examples of healthy fats: avocado, olives, virgin olive oil, dark chocolate (?72% Cocoa), nuts (peanuts, almonds, walnuts, cashews, pecans, etc.) None to Low Intake of Animal Sources of Protein Meat  sources: chicken, turkey, salmon, tuna. Limit to 4 ounces of meat at one time. Consider limiting dairy sources, but when choosing dairy focus on: PLAIN Greek yogurt, cottage cheese, high-protein milk Fruit Choose berries  When to Eat Intermittent Fasting: Choosing not to eat for a specific time period, but DO FOCUS ON HYDRATION when fasting Multiple Techniques: Time Restricted Eating: eat 3 meals in a day, each meal lasting no more than 60 minutes, no snacks between meals 16-18 hour fast: fast for 16 to 18 hours up to 7 days a week. Often suggested to start with 2-3 nonconsecutive days per week.  Remember the time you sleep is counted as fasting.  Examples of eating schedule: Fast from 7:00pm-11:00am. Eat between 11:00am-7:00pm.  24-hour fast: fast for 24 hours up to every other day. Often suggested to start with 1 day per week Remember the time you sleep is counted as fasting Examples of eating schedule:  Eating day: eat 2-3 meals on your eating day. If doing 2 meals, each meal should last no more than 90 minutes. If doing 3 meals, each meal should last no more than 60 minutes. Finish last meal by 7:00pm. Fasting day: Fast until 7:00pm.  IF YOU FEEL UNWELL FOR ANY REASON/IN ANY WAY WHEN FASTING, STOP FASTING BY EATING A NUTRITIOUS SNACK OR LIGHT MEAL ALWAYS FOCUS ON HYDRATION DURING FASTS Acceptable Hydration sources: water, broths, tea/coffee (black tea/coffee is best but using a small amount of whole-fat dairy products in coffee/tea is acceptable).  Poor Hydration Sources: anything with sugar or artificial sweeteners added to it  These recommendations have been developed for patients that are actively receiving medical care from either Dr. Yareli Carthen or Sarah Gray, DNP, NP-C at [facility]. These recommendations   are developed for patients with specific medical conditions and are not meant to be distributed or used by others that are not actively receiving care from either provider  listed above at [facility]. It is not appropriate to participate in the above eating plans without proper medical supervision.   Reference: Fung, J. The obesity code. [place]: [place]; 2016.   

## 2021-03-29 NOTE — Progress Notes (Signed)
Metrics: Intervention Frequency ACO  Documented Smoking Status Yearly  Screened one or more times in 24 months  Cessation Counseling or  Active cessation medication Past 24 months  Past 24 months   Guideline developer: UpToDate (See UpToDate for funding source) Date Released: 2014       Wellness Office Visit  Subjective:  Patient ID: Julian Scott, male    DOB: 03-Nov-1965  Age: 55 y.o. MRN: [id number]  CC: This 55 year old man comes to our practice as a new patient to establish care. HPI  He has a history of diabetes which was diagnosed approximately 1 year ago.  He also has a history of uncontrolled hypertension and chronic kidney disease. He was in the emergency room at [facility] because of left kidney stone.  He needs referral to a urologist. His hemoglobin A1c was over 12% when it was checked a couple of days ago. He also has a history of hypogonadism with low testosterone levels and takes testosterone every 2 weeks although he has really not been taking it on a regular basis. Past Medical History:  Diagnosis Date   Diabetes mellitus ([place])    Family history of prostate cancer in father 12/16/2015   Hyperlipidemia    Hypertension    Primary insomnia 02/01/2017   Testosterone deficiency    Past Surgical History:  Procedure Laterality Date   Cyst Removal Back     CYST REMOVAL HAND       Family History  Problem Relation Age of Onset   Hypertension Mother    Diabetes Mother    Parkinson's disease Father    Dementia Father    Prostate cancer Father    Hypertension Brother    Diabetes Maternal Grandmother    Heart attack Maternal Grandfather    Dementia Paternal Grandmother    Lung disease Paternal Grandfather    Colon cancer Neg Hx    Esophageal cancer Neg Hx    Rectal cancer Neg Hx    Stomach cancer Neg Hx    Colon polyps Neg Hx     Social History   Social History Narrative   Separated since June 2021,filing for divorce,married for 10 years,this is  second marriage.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   Social History   Tobacco Use   Smoking status: Never   Smokeless tobacco: Never  Substance Use Topics   Alcohol use: Not Currently    Comment: Every blue moon - socially    Current Meds  Medication Sig   amLODipine (NORVASC) 10 MG tablet Take 1 tablet (10 mg total) by mouth daily.   atorvastatin (LIPITOR) 40 MG tablet SMARTSIG:1 Tablet(s) By Mouth Every Evening   Blood Glucose Monitoring Suppl (FIFTY50 GLUCOSE METER 2.0) w/Device KIT See admin instructions.   ciprofloxacin (CIPRO) 500 MG tablet Take by mouth.   fish oil-omega-3 fatty acids 1000 MG capsule Take 2 capsules (2 g total) by mouth 2 (two) times daily in the am and at bedtime.Marland Kitchen   glipiZIDE (GLUCOTROL) 10 MG tablet Take 1 tablet (10 mg total) by mouth 2 (two) times daily before a meal. (Needs to be seen before next refill)   glucose blood (ACCU-CHEK AVIVA PLUS) test strip Test daily before all meals/snacks and once before bedtime.   hydrALAZINE (APRESOLINE) 25 MG tablet Take 1 tablet (25 mg total) by mouth 3 (three) times daily.   Lancets (ACCU-CHEK MULTICLIX) lancets Test daily before all meals/snacks and once before bedtime.   losartan-hydrochlorothiazide (HYZAAR) 100-25 MG tablet Take 1 tablet by mouth daily.  metFORMIN (GLUCOPHAGE) 500 MG tablet Take by mouth.   metoprolol succinate (TOPROL XL) 25 MG 24 hr tablet Take 1 tablet (25 mg total) by mouth daily.   Semaglutide (RYBELSUS) 3 MG TABS Take 3 mg by mouth daily.   tamsulosin (FLOMAX) 0.4 MG CAPS capsule Take 1 capsule (0.4 mg total) by mouth daily after supper.   temazepam (RESTORIL) 30 MG capsule Take 1 capsule (30 mg total) by mouth at bedtime as needed for sleep.   [DISCONTINUED] predniSONE (DELTASONE) 5 MG tablet Take by mouth.   [DISCONTINUED] testosterone cypionate (DEPOTESTOSTERONE CYPIONATE) 200 MG/ML injection Inject 1 mL (200 mg total) into the muscle every 14 (fourteen) days.     [place]  Office Visit from 03/29/2021 in [facility]  PHQ-9 Total Score 1       Objective:   Today's Vitals: BP (!) 150/100   Pulse (!) 102   Temp (!) 97.3 F (36.3 C) (Temporal)   Ht 5' 8.75" (1.746 m)   Wt 217 lb 12.8 oz (98.8 kg)   SpO2 98%   BMI 32.40 kg/m  Vitals with BMI 03/29/2021 09/06/2020 04/27/2020  Height 5' 8.75" '5\' 10"'  -  Weight 217 lbs 13 oz 216 lbs -  BMI 77.41 28.78 -  Systolic 676 720 947  Diastolic 096 99 283  Pulse 102 85 -     Physical Exam Ill systemically well but he is obese and blood pressure is not controlled.  He is alert and orientated.      Assessment   1. DM type 2 with diabetic dyslipidemia (HCC)   2. Stage 3a chronic kidney disease ([place])   3. Primary insomnia   4. Uncontrolled hypertension   5. Hypogonadism in male   52. Kidney stone on left side   7. Special screening for malignant neoplasm of prostate   8. Vitamin D deficiency disease       Tests ordered Orders Placed This Encounter  Procedures   Testosterone Total,Free,Bio, Males   PSA, Total with Reflex to PSA, Free   COMPLETE METABOLIC PANEL WITH GFR   CBC   VITAMIN D 25 Hydroxy (Vit-D Deficiency, Fractures)   T3, free   T4, free   TSH   Prolactin   Ambulatory referral to Urology      Plan: 1.  In view of his chronic kidney disease and hypertension, I have told him to discontinue Hyzaar, continue amlodipine and I am going to add hydralazine 25 mg 3 times a day.  He will continue with metoprolol 25 mg once a day. 2.  Discontinue statin therapy and we will work on nutrition.  We talked about intermittent fasting and I want him to fast 16 hours every day, ensuring adequate hydration.  We will discuss more the diet when I see him in subsequent visits. 3.  Continue with glipizide for the time being and I am going to add Rybelsus to his medication list.  Discontinue metformin in view of his chronic kidney disease. 4.  Blood work is ordered. 5.  I will refer him to  urology regarding his kidney stone. 6.  I will see him in the next couple of weeks to review his blood work and further recommendations.    Meds ordered this encounter  Medications   hydrALAZINE (APRESOLINE) 25 MG tablet    Sig: Take 1 tablet (25 mg total) by mouth 3 (three) times daily.    Dispense:  90 tablet    Refill:  3   Semaglutide (  RYBELSUS) 3 MG TABS    Sig: Take 3 mg by mouth daily.    Dispense:  30 tablet    Refill:  3     Gaje Tennyson Luther Parody, MD

## 2021-03-30 LAB — COMPLETE METABOLIC PANEL WITH GFR
AG Ratio: 1.3 (calc) (ref 1.0–2.5)
ALT: 51 U/L — ABNORMAL HIGH (ref 9–46)
AST: 34 U/L (ref 10–35)
Albumin: 4.3 g/dL (ref 3.6–5.1)
Alkaline phosphatase (APISO): 96 U/L (ref 35–144)
BUN/Creatinine Ratio: 16 (calc) (ref 6–22)
BUN: 32 mg/dL — ABNORMAL HIGH (ref 7–25)
CO2: 24 mmol/L (ref 20–32)
Calcium: 10.1 mg/dL (ref 8.6–10.3)
Chloride: 95 mmol/L — ABNORMAL LOW (ref 98–110)
Creat: 1.95 mg/dL — ABNORMAL HIGH (ref 0.70–1.33)
GFR, Est African American: 44 mL/min/{1.73_m2} — ABNORMAL LOW (ref >=60)
GFR, Est Non African American: 38 mL/min/{1.73_m2} — ABNORMAL LOW (ref >=60)
Globulin: 3.2 g/dL (calc) (ref 1.9–3.7)
Glucose, Bld: 335 mg/dL — ABNORMAL HIGH (ref 65–99)
Potassium: 3.7 mmol/L (ref 3.5–5.3)
Sodium: 130 mmol/L — ABNORMAL LOW (ref 135–146)
Total Bilirubin: 1.7 mg/dL — ABNORMAL HIGH (ref 0.2–1.2)
Total Protein: 7.5 g/dL (ref 6.1–8.1)

## 2021-03-30 LAB — CBC
HCT: 44.8 % (ref 38.5–50.0)
Hemoglobin: 14.8 g/dL (ref 13.2–17.1)
MCH: 27.4 pg (ref 27.0–33.0)
MCHC: 33 g/dL (ref 32.0–36.0)
MCV: 83 fL (ref 80.0–100.0)
MPV: 10.5 fL (ref 7.5–12.5)
Platelets: 251 10*3/uL (ref 140–400)
RBC: 5.4 10*6/uL (ref 4.20–5.80)
RDW: 13.7 % (ref 11.0–15.0)
WBC: 3.8 10*3/uL (ref 3.8–10.8)

## 2021-03-30 LAB — TESTOSTERONE TOTAL,FREE,BIO, MALES
Albumin: 4.3 g/dL (ref 3.6–5.1)
Sex Hormone Binding: 28 nmol/L (ref 10–50)
Testosterone: 120 ng/dL — ABNORMAL LOW (ref 250–827)

## 2021-03-30 LAB — T3, FREE: T3, Free: 2.8 pg/mL (ref 2.3–4.2)

## 2021-03-30 LAB — T4, FREE: Free T4: 1.4 ng/dL (ref 0.8–1.8)

## 2021-03-30 LAB — VITAMIN D 25 HYDROXY (VIT D DEFICIENCY, FRACTURES): Vit D, 25-Hydroxy: 37 ng/mL (ref 30–100)

## 2021-03-30 LAB — PROLACTIN: Prolactin: 8.9 ng/mL (ref 2.0–18.0)

## 2021-03-30 LAB — TSH: TSH: 1.74 mIU/L (ref 0.40–4.50)

## 2021-03-30 LAB — PSA, TOTAL WITH REFLEX TO PSA, FREE: PSA, Total: 0.9 ng/mL (ref <=4.0)

## 2021-04-04 ENCOUNTER — Telehealth (INDEPENDENT_AMBULATORY_CARE_PROVIDER_SITE_OTHER): Payer: Self-pay

## 2021-04-05 ENCOUNTER — Other Ambulatory Visit (INDEPENDENT_AMBULATORY_CARE_PROVIDER_SITE_OTHER): Payer: Self-pay | Admitting: Internal Medicine

## 2021-04-05 DIAGNOSIS — I10 Essential (primary) hypertension: Secondary | ICD-10-CM

## 2021-04-05 MED ORDER — AMLODIPINE BESYLATE 10 MG PO TABS: 10.0000 mg | ORAL_TABLET | Freq: Every day | ORAL | 1 refills | Status: DC

## 2021-04-05 NOTE — Telephone Encounter (Signed)
Okay, I have sent the amlodipine to the [facility] in [place].

## 2021-04-11 ENCOUNTER — Other Ambulatory Visit: Payer: Self-pay

## 2021-04-11 ENCOUNTER — Encounter (INDEPENDENT_AMBULATORY_CARE_PROVIDER_SITE_OTHER): Payer: Self-pay | Admitting: Internal Medicine

## 2021-04-11 ENCOUNTER — Ambulatory Visit (INDEPENDENT_AMBULATORY_CARE_PROVIDER_SITE_OTHER): Payer: Commercial Managed Care - PPO | Admitting: Internal Medicine

## 2021-04-11 VITALS — BP 146/71 | HR 95 | Temp 98.1°F | Resp 18 | Ht 68.0 in | Wt 222.8 lb

## 2021-04-11 DIAGNOSIS — E785 Hyperlipidemia, unspecified: Secondary | ICD-10-CM

## 2021-04-11 DIAGNOSIS — N1831 Chronic kidney disease, stage 3a: Secondary | ICD-10-CM | POA: Diagnosis not present

## 2021-04-11 DIAGNOSIS — E291 Testicular hypofunction: Secondary | ICD-10-CM

## 2021-04-11 DIAGNOSIS — I10 Essential (primary) hypertension: Secondary | ICD-10-CM | POA: Diagnosis not present

## 2021-04-11 DIAGNOSIS — E1169 Type 2 diabetes mellitus with other specified complication: Secondary | ICD-10-CM

## 2021-04-11 MED ORDER — TESTOSTERONE CYPIONATE 200 MG/ML IM SOLN: 100.0000 mg | INTRAMUSCULAR | 2 refills | Status: DC

## 2021-04-11 NOTE — Progress Notes (Signed)
Metrics: Intervention Frequency ACO  Documented Smoking Status Yearly  Screened one or more times in 24 months  Cessation Counseling or  Active cessation medication Past 24 months  Past 24 months   Guideline developer: UpToDate (See UpToDate for funding source) Date Released: 2014       Wellness Office Visit  Subjective:  Patient ID: Julian Scott, male    DOB: 1965/11/02  Age: 55 y.o. MRN: [id number]  CC: This man comes in for follow-up of uncontrolled hypertension, uncontrolled diabetes, chronic kidney disease and hypogonadism. HPI  There is some confusion regarding his medications.  He appears to still be taking Hyzaar when I requested that he stopped taking it. He has started taking hydralazine 25 mg 3 times a day together with amlodipine 10 mg daily. He is trying to work harder on his nutrition and does appear to do intermittent fasting like we discussed. His testosterone levels are very low and thankfully his prolactin level was not elevated.  He has taken testosterone therapy in the past but it was dosed as taking it every 2 weeks, this is not the correct dosing. Past Medical History:  Diagnosis Date   Diabetes mellitus ([place])    Family history of prostate cancer in father 12/16/2015   Hyperlipidemia    Hypertension    Primary insomnia 02/01/2017   Testosterone deficiency    Past Surgical History:  Procedure Laterality Date   Cyst Removal Back     CYST REMOVAL HAND       Family History  Problem Relation Age of Onset   Hypertension Mother    Diabetes Mother    Parkinson's disease Father    Dementia Father    Prostate cancer Father    Hypertension Brother    Diabetes Maternal Grandmother    Heart attack Maternal Grandfather    Dementia Paternal Grandmother    Lung disease Paternal Grandfather    Colon cancer Neg Hx    Esophageal cancer Neg Hx    Rectal cancer Neg Hx    Stomach cancer Neg Hx    Colon polyps Neg Hx     Social History   Social History  Narrative   Separated since June 2021,filing for divorce,married for 10 years,this is second marriage.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   Social History   Tobacco Use   Smoking status: Never   Smokeless tobacco: Never  Substance Use Topics   Alcohol use: Not Currently    Comment: Every blue moon - socially    Current Meds  Medication Sig   amLODipine (NORVASC) 10 MG tablet Take 1 tablet (10 mg total) by mouth daily.   atorvastatin (LIPITOR) 40 MG tablet SMARTSIG:1 Tablet(s) By Mouth Every Evening   Blood Glucose Monitoring Suppl (FIFTY50 GLUCOSE METER 2.0) w/Device KIT See admin instructions.   fish oil-omega-3 fatty acids 1000 MG capsule Take 2 capsules (2 g total) by mouth 2 (two) times daily in the am and at bedtime.Marland Kitchen   glipiZIDE (GLUCOTROL) 10 MG tablet Take 1 tablet (10 mg total) by mouth 2 (two) times daily before a meal. (Needs to be seen before next refill)   glucose blood (ACCU-CHEK AVIVA PLUS) test strip Test daily before all meals/snacks and once before bedtime.   hydrALAZINE (APRESOLINE) 25 MG tablet Take 1 tablet (25 mg total) by mouth 3 (three) times daily.   Lancets (ACCU-CHEK MULTICLIX) lancets Test daily before all meals/snacks and once before bedtime.   losartan-hydrochlorothiazide (HYZAAR) 100-25 MG tablet Take 1 tablet by mouth daily.  metoprolol succinate (TOPROL XL) 25 MG 24 hr tablet Take 1 tablet (25 mg total) by mouth daily.   Semaglutide (RYBELSUS) 3 MG TABS Take 3 mg by mouth daily.   tamsulosin (FLOMAX) 0.4 MG CAPS capsule Take 1 capsule (0.4 mg total) by mouth daily after supper.   temazepam (RESTORIL) 30 MG capsule Take 1 capsule (30 mg total) by mouth at bedtime as needed for sleep.   testosterone cypionate (DEPO-TESTOSTERONE) 200 MG/ML injection Inject 0.5 mLs (100 mg total) into the muscle every 7 (seven) days.     [place] Office Visit from 03/29/2021 in [facility]  PHQ-9 Total Score 1       Objective:   Today's Vitals: BP  (!) 146/71 (BP Location: Right Arm, Patient Position: Sitting, Cuff Size: Large)   Pulse 95   Temp 98.1 F (36.7 C) (Temporal)   Resp 18   Ht '5\' 8"'  (1.727 m)   Wt 222 lb 12.8 oz (101.1 kg)   SpO2 96%   BMI 33.88 kg/m  Vitals with BMI 04/11/2021 03/29/2021 09/06/2020  Height '5\' 8"'  5' 8.75" '5\' 10"'   Weight 222 lbs 13 oz 217 lbs 13 oz 216 lbs  BMI 33.88 89.38 10.17  Systolic 510 258 527  Diastolic 71 782 99  Pulse 95 102 85     Physical Exam   Blood pressure has improved.  He has not lost any weight.    Assessment   1. Hypogonadism in male   2. Uncontrolled hypertension   3. DM type 2 with diabetic dyslipidemia ([place])   4. Stage 3a chronic kidney disease ([place])       Tests ordered No orders of the defined types were placed in this encounter.    Plan: 1.  He is a candidate for testosterone therapy and I am going to start him on testosterone 100 mg intramuscular once a week.  I have sent a new prescription for this. 2.  I have told him to stop taking the Hyzaar and continue with amlodipine 10 mg daily and hydralazine 25 mg 3 times a day.  I will ask his sister to keep an eye on his blood pressure at home and if it is elevated, we can increase the hydralazine. 3.  Continue to focus on nutrition and he has been taking Rybelsus now as well as glipizide.  His blood sugar readings appear to be improving. 4.  I will see him at the end of August to see how he is doing and we will do blood work then.   Meds ordered this encounter  Medications   testosterone cypionate (DEPO-TESTOSTERONE) 200 MG/ML injection    Sig: Inject 0.5 mLs (100 mg total) into the muscle every 7 (seven) days.    Dispense:  10 mL    Refill:  2     Analycia Khokhar Luther Parody, MD

## 2021-04-11 NOTE — Progress Notes (Signed)
So since he became anew pt at [facility]  he is working on learning how to take blood sugars. Keeps a log of what the y are running 3x day.  He bought a pill tray  to take medicine daily . 4x/day . He is doing well on taking and drinking the water and coffee. Fasting from bedtime to lunchtime. He said he fell off one day and had to eat felt funny.

## 2021-04-13 ENCOUNTER — Ambulatory Visit (INDEPENDENT_AMBULATORY_CARE_PROVIDER_SITE_OTHER): Payer: Commercial Managed Care - PPO | Admitting: Urology

## 2021-04-13 ENCOUNTER — Ambulatory Visit (HOSPITAL_COMMUNITY)
Admission: RE | Admit: 2021-04-13 | Discharge: 2021-04-13 | Disposition: A | Discharge status: Home / Self Care | Payer: Commercial Managed Care - PPO | Source: Ambulatory Visit | Attending: Urology | Admitting: Urology

## 2021-04-13 ENCOUNTER — Other Ambulatory Visit: Payer: Self-pay

## 2021-04-13 ENCOUNTER — Encounter: Payer: Self-pay | Admitting: Urology

## 2021-04-13 VITALS — BP 136/85 | HR 97 | Ht 68.0 in | Wt 222.0 lb

## 2021-04-13 DIAGNOSIS — N200 Calculus of kidney: Secondary | ICD-10-CM

## 2021-04-13 LAB — URINALYSIS, ROUTINE W REFLEX MICROSCOPIC
Bilirubin, UA: NEGATIVE
Ketones, UA: NEGATIVE
Leukocytes,UA: NEGATIVE
Nitrite, UA: NEGATIVE
RBC, UA: NEGATIVE
Specific Gravity, UA: 1.015 (ref 1.005–1.030)
Urobilinogen, Ur: 1 mg/dL (ref 0.2–1.0)
pH, UA: 7 (ref 5.0–7.5)

## 2021-04-13 MED ORDER — TAMSULOSIN HCL 0.4 MG PO CAPS: 0.4000 mg | ORAL_CAPSULE | Freq: Every day | ORAL | 3 refills | Status: DC

## 2021-04-13 NOTE — Patient Instructions (Signed)
[employer] (25th ed., pp. 811-816). [place], [place]: [employer], Else[employer]. Retrieved from https://www.clinicalkey.com/#!/content/book/[email or web address]?[email or web address]">  Lithotripsy  Lithotripsy is a treatment that can help break up kidney stones that are too large to pass on their own. This is a nonsurgical procedure that crushes a kidney stone with shock waves. These shock waves pass through your body and focus on the kidney stone. They cause the kidney stone to break up into smaller pieces while it is still in the urinary tract. The smaller pieces of stone canpass more easily out of your body in the urine. Tell a health care provider about: Any allergies you have. All medicines you are taking, including vitamins, herbs, eye drops, creams, and over-the-counter medicines. Any problems you or family members have had with anesthetic medicines. Any blood disorders you have. Any surgeries you have had. Any medical conditions you have. Whether you are pregnant or may be pregnant. What are the risks? Generally, this is a safe procedure. However, problems may occur, including: Infection. Bleeding from the kidney. Bruising of the kidney or skin. Scarring of the kidney, which can lead to: Increased blood pressure. Poor kidney function. Return (recurrence) of kidney stones. Damage to other structures or organs, such as the liver, colon, spleen, or pancreas. Blockage (obstruction) of the tube that carries urine from the kidney to the bladder (ureter). Failure of the kidney stone to break into pieces (fragments). What happens before the procedure? Staying hydrated Follow instructions from your health care provider about hydration, which may include: Up to 2 hours before the procedure - you may continue to drink clear liquids, such as water, clear fruit juice, black coffee, and plain tea. Eating and drinking restrictions Follow instructions from your health care provider  about eating and drinking, which may include: 8 hours before the procedure - stop eating heavy meals or foods, such as meat, fried foods, or fatty foods. 6 hours before the procedure - stop eating light meals or foods, such as toast or cereal. 6 hours before the procedure - stop drinking milk or drinks that contain milk. 2 hours before the procedure - stop drinking clear liquids. Medicines Ask your health care provider about: Changing or stopping your regular medicines. This is especially important if you are taking diabetes medicines or blood thinners. Taking medicines such as aspirin and ibuprofen. These medicines can thin your blood. Do not take these medicines unless your health care provider tells you to take them. Taking over-the-counter medicines, vitamins, herbs, and supplements. Tests You may have tests, such as: Blood tests. Urine tests. Imaging tests, such as a CT scan. General instructions Plan to have someone take you home from the hospital or clinic. If you will be going home right after the procedure, plan to have someone with you for 24 hours. Ask your health care provider what steps will be taken to help prevent infection. These may include washing skin with a germ-killing soap. What happens during the procedure?  An IV will be inserted into one of your veins. You will be given one or more of the following: A medicine to help you relax (sedative). A medicine to make you fall asleep (general anesthetic). A water-filled cushion may be placed behind your kidney or on your abdomen. In some cases, you may be placed in a tub of lukewarm water. Your body will be positioned in a way that makes it easy to target the kidney stone. An X-ray or ultrasound exam will be done to locate your stone. Shock waves will be aimed   at the stone. If you are awake, you may feel a tapping sensation as the shock waves pass through your body. A flexible tube with holes in it (stent) may be placed in  the ureter. This will help keep urine flowing from the kidney if the fragments of the stone have been blocking the ureter. The procedure may vary among health care providers and hospitals. What happens after the procedure? You may have an X-ray to see whether the procedure was able to break up the kidney stone and how much of the stone has passed. If large stone fragments remain after treatment, you may need to have a second procedure at a later time. Your blood pressure, heart rate, breathing rate, and blood oxygen level will be monitored until you leave the hospital or clinic. You may be given antibiotics or pain medicine as needed. If a stent was placed in your ureter during surgery, it may stay in place for a few weeks. You may need to strain your urine to collect pieces of the kidney stone for testing. You will need to drink plenty of water. If you were given a sedative during the procedure, it can affect you for several hours. Do not drive or operate machinery until your health care provider says that it is safe. Summary Lithotripsy is a treatment that can help break up kidney stones that are too large to pass on their own. Lithotripsy is a nonsurgical procedure that crushes a kidney stone with shock waves. Generally, this is a safe procedure. However, problems may occur, including damage to the kidney or other organs, infection, or obstruction of the tube that carries urine from the kidney to the bladder (ureter). You may have a stent placed in your ureter to help drain your urine. This stent may stay in place for a few weeks. After the procedure, you will need to drink plenty of water. You may be asked to strain your urine to collect pieces of the kidney stone for testing. This information is not intended to replace advice given to you by your health care provider. Make sure you discuss any questions you have with your healthcare provider. Document Revised: 07/15/2019 Document Reviewed:  07/16/2019 Elsevier Patient Education  2022 [employer]vier Inc.  

## 2021-04-13 NOTE — Progress Notes (Signed)
04/13/2021 9:24 AM   Julian Scott [birth year] [id number]  Referring provider: Doree Albee, MD [place],  [place] [address]  nephrolithiasis   HPI: Mr Julian Scott is a 55yo here for evaluation of nephrolithiasis. He was having left flank pain for 3 weeks and then presented to [facility] on 6/12 and was diagnosed with a 67m left lower pole calculus. The flank pain resolved after his ER visit. At the time of his ER visit he was also having severe LUTS and was started on flomax 0.479m His LUTS have significantly improved since starting flomax. IPSS14 QOL 3.    PMH: Past Medical History:  Diagnosis Date   [place] mellitus ([place]   Family history of prostate cancer in father 12/16/2015   Hyperlipidemia    Hypertension    Primary insomnia 02/01/2017   Testosterone deficiency     Surgical History: Past Surgical History:  Procedure Laterality Date   Cyst Removal Back     CYST REMOVAL HAND      Home Medications:  Allergies as of 04/13/2021   No Known Allergies      Medication List        Accurate as of April 13, 2021  9:24 AM. If you have any questions, ask your nurse or doctor.          Accu-Chek Aviva Plus test strip Generic drug: glucose blood Test daily before all meals/snacks and once before bedtime.   accu-chek multiclix lancets Test daily before all meals/snacks and once before bedtime.   amLODipine 10 MG tablet Commonly known as: NORVASC Take 1 tablet (10 mg total) by mouth daily.   atorvastatin 40 MG tablet Commonly known as: LIPITOR SMARTSIG:1 Tablet(s) By Mouth Every Evening   Fifty50 Glucose Meter 2.0 w/Device Kit See admin instructions.   fish oil-omega-3 fatty acids 1000 MG capsule Take 2 capsules (2 g total) by mouth 2 (two) times daily in the am and at bedtime.. Marland Kitchen glipiZIDE 10 MG tablet Commonly known as: GLUCOTROL Take 1 tablet (10 mg total) by mouth 2 (two) times daily before a meal. (Needs to be seen before next  refill)   hydrALAZINE 25 MG tablet Commonly known as: APRESOLINE Take 1 tablet (25 mg total) by mouth 3 (three) times daily.   losartan-hydrochlorothiazide 100-25 MG tablet Commonly known as: HYZAAR Take 1 tablet by mouth daily.   metFORMIN 500 MG tablet Commonly known as: GLUCOPHAGE Take by mouth.   metoprolol succinate 25 MG 24 hr tablet Commonly known as: Toprol XL Take 1 tablet (25 mg total) by mouth daily.   Rybelsus 3 MG Tabs Generic drug: Semaglutide Take 3 mg by mouth daily.   tamsulosin 0.4 MG Caps capsule Commonly known as: FLOMAX Take 1 capsule (0.4 mg total) by mouth daily after supper.   temazepam 30 MG capsule Commonly known as: RESTORIL Take 1 capsule (30 mg total) by mouth at bedtime as needed for sleep.   testosterone cypionate 200 MG/ML injection Commonly known as: Depo-Testosterone Inject 0.5 mLs (100 mg total) into the muscle every 7 (seven) days.        Allergies: No Known Allergies  Family History: Family History  Problem Relation Age of Onset   Hypertension Mother    Diabetes Mother    Parkinson's disease Father    Dementia Father    Prostate cancer Father    Hypertension Brother    Diabetes Maternal Grandmother    Heart attack Maternal Grandfather    Dementia  Paternal Grandmother    Lung disease Paternal Grandfather    Colon cancer Neg Hx    Esophageal cancer Neg Hx    Rectal cancer Neg Hx    Stomach cancer Neg Hx    Colon polyps Neg Hx     Social History:  reports that he has never smoked. He has never used smokeless tobacco. He reports previous alcohol use. He reports that he does not use drugs.  ROS: All other review of systems were reviewed and are negative except what is noted above in HPI  Physical Exam: BP 136/85   Pulse 97   Ht _0  (1.727 m)   Wt 222 lb (100.7 kg)   BMI 33.75 kg/m   Constitutional:  Alert and oriented, No acute distress. HEENT: [place] AT, moist mucus membranes.  Trachea midline, no  masses. Cardiovascular: No clubbing, cyanosis, or edema. Respiratory: Normal respiratory effort, no increased work of breathing. GI: Abdomen is soft, nontender, nondistended, no abdominal masses GU: No CVA tenderness.  Lymph: No cervical or inguinal lymphadenopathy. Skin: No rashes, bruises or suspicious lesions. Neurologic: Grossly intact, no focal deficits, moving all 4 extremities. Psychiatric: Normal mood and affect.  Laboratory Data: Lab Results  Component Value Date   WBC 3.8 03/29/2021   HGB 14.8 03/29/2021   HCT 44.8 03/29/2021   MCV 83.0 03/29/2021   PLT 251 03/29/2021    Lab Results  Component Value Date   CREATININE 1.95 (H) 03/29/2021    No results found for: PSA  Lab Results  Component Value Date   TESTOSTERONE 120 (L) 03/29/2021    Lab Results  Component Value Date   HGBA1C 7.2 (H) 09/06/2020    Urinalysis No results found for: COLORURINE, APPEARANCEUR, LABSPEC, PHURINE, GLUCOSEU, HGBUR, BILIRUBINUR, KETONESUR, PROTEINUR, UROBILINOGEN, NITRITE, LEUKOCYTESUR  Lab Results  Component Value Date   LABMICR 6.5 07/08/2019    Pertinent Imaging: KUb today: Images reviewed and discussed with the patient  No results found for this or any previous visit.  No results found for this or any previous visit. . No results found for this or any previous visit.  No results found for this or any previous visit.  No results found for this or any previous visit.  No results found for this or any previous visit.  No results found for this or any previous visit.  No results found for this or any previous visit.   Assessment & Plan:    1. Kidney stones -We discussed the management of kidney stones. These options include observation, ureteroscopy, shockwave lithotripsy (ESWL) and percutaneous nephrolithotomy (PCNL). We discussed which options are relevant to the patient's stone(s). We discussed the natural history of kidney stones as well as the complications of  untreated stones and the impact on quality of life without treatment as well as with each of the above listed treatments. We also discussed the efficacy of each treatment in its ability to clear the stone burden. With any of these management options I discussed the signs and symptoms of infection and the need for emergent treatment should these be experienced. For each option we discussed the ability of each procedure to clear the patient of their stone burden.   For observation I described the risks which include but are not limited to silent renal damage, life-threatening infection, need for emergent surgery, failure to pass stone and pain.   For ureteroscopy I described the risks which include bleeding, infection, damage to contiguous structures, positioning injury, ureteral stricture, ureteral avulsion, ureteral  injury, need for prolonged ureteral stent, inability to perform ureteroscopy, need for an interval procedure, inability to clear stone burden, stent discomfort/pain, heart attack, stroke, pulmonary embolus and the inherent risks with general anesthesia.   For shockwave lithotripsy I described the risks which include arrhythmia, kidney contusion, kidney hemorrhage, need for transfusion, pain, inability to adequately break up stone, inability to pass stone fragments, Steinstrasse, infection associated with obstructing stones, need for alternate surgical procedure, need for repeat shockwave lithotripsy, MI, CVA, PE and the inherent risks with anesthesia/conscious sedation.   For PCNL I described the risks including positioning injury, pneumothorax, hydrothorax, need for chest tube, inability to clear stone burden, renal laceration, arterial venous fistula or malformation, need for embolization of kidney, loss of kidney or renal function, need for repeat procedure, need for prolonged nephrostomy tube, ureteral avulsion, MI, CVA, PE and the inherent risks of general anesthesia.   - The patient would  like to proceed with Left ESWL - Urinalysis, Routine w reflex microscopic   No follow-ups on file.  Nicolette Bang, MD  [facility] [place]

## 2021-04-13 NOTE — Progress Notes (Signed)
Urological Symptom Review  Patient is experiencing the following symptoms: Frequent urination Hard to postpone urination Burning/pain with urination Get up at night to urinate Leakage of urine Stream starts and stops Trouble starting stream Blood in urine Painful intercourse Weak stream Erection problems (male only) Penile pain (male only)  Kidney stones  Review of Systems  Gastrointestinal (upper)  : Indigestion/heartburn  Gastrointestinal (lower) : Negative for lower GI symptoms  Constitutional : Fatigue  Skin: Negative for skin symptoms  Eyes: Blurred vision  Ear/Nose/Throat : Negative for Ear/Nose/Throat symptoms  Hematologic/Lymphatic: Negative for Hematologic/Lymphatic symptoms  Cardiovascular : Leg swelling Chest pain  Respiratory : Negative for respiratory symptoms  Endocrine: Excessive thirst  Musculoskeletal: Back pain  Neurological: Headaches Dizziness  Psychologic: Depression Anxiety

## 2021-04-13 NOTE — H&P (View-Only) (Signed)
04/13/2021 9:24 AM   Julian Scott [birth year] [id number]  Referring provider: Doree Albee, MD [place],  [place] [address]  nephrolithiasis   HPI: Julian Scott is a 55yo here for evaluation of nephrolithiasis. He was having left flank pain for 3 weeks and then presented to [facility] on 6/12 and was diagnosed with a 67m left lower pole calculus. The flank pain resolved after his ER visit. At the time of his ER visit he was also having severe LUTS and was started on flomax 0.479m His LUTS have significantly improved since starting flomax. IPSS14 QOL 3.    PMH: Past Medical History:  Diagnosis Date   [place] mellitus ([place]   Family history of prostate cancer in father 12/16/2015   Hyperlipidemia    Hypertension    Primary insomnia 02/01/2017   Testosterone deficiency     Surgical History: Past Surgical History:  Procedure Laterality Date   Cyst Removal Back     CYST REMOVAL HAND      Home Medications:  Allergies as of 04/13/2021   No Known Allergies      Medication List        Accurate as of April 13, 2021  9:24 AM. If you have any questions, ask your nurse or doctor.          Accu-Chek Aviva Plus test strip Generic drug: glucose blood Test daily before all meals/snacks and once before bedtime.   accu-chek multiclix lancets Test daily before all meals/snacks and once before bedtime.   amLODipine 10 MG tablet Commonly known as: NORVASC Take 1 tablet (10 mg total) by mouth daily.   atorvastatin 40 MG tablet Commonly known as: LIPITOR SMARTSIG:1 Tablet(s) By Mouth Every Evening   Fifty50 Glucose Meter 2.0 w/Device Kit See admin instructions.   fish oil-omega-3 fatty acids 1000 MG capsule Take 2 capsules (2 g total) by mouth 2 (two) times daily in the am and at bedtime.. Marland Kitchen glipiZIDE 10 MG tablet Commonly known as: GLUCOTROL Take 1 tablet (10 mg total) by mouth 2 (two) times daily before a meal. (Needs to be seen before next  refill)   hydrALAZINE 25 MG tablet Commonly known as: APRESOLINE Take 1 tablet (25 mg total) by mouth 3 (three) times daily.   losartan-hydrochlorothiazide 100-25 MG tablet Commonly known as: HYZAAR Take 1 tablet by mouth daily.   metFORMIN 500 MG tablet Commonly known as: GLUCOPHAGE Take by mouth.   metoprolol succinate 25 MG 24 hr tablet Commonly known as: Toprol XL Take 1 tablet (25 mg total) by mouth daily.   Rybelsus 3 MG Tabs Generic drug: Semaglutide Take 3 mg by mouth daily.   tamsulosin 0.4 MG Caps capsule Commonly known as: FLOMAX Take 1 capsule (0.4 mg total) by mouth daily after supper.   temazepam 30 MG capsule Commonly known as: RESTORIL Take 1 capsule (30 mg total) by mouth at bedtime as needed for sleep.   testosterone cypionate 200 MG/ML injection Commonly known as: Depo-Testosterone Inject 0.5 mLs (100 mg total) into the muscle every 7 (seven) days.        Allergies: No Known Allergies  Family History: Family History  Problem Relation Age of Onset   Hypertension Mother    Diabetes Mother    Parkinson's disease Father    Dementia Father    Prostate cancer Father    Hypertension Brother    Diabetes Maternal Grandmother    Heart attack Maternal Grandfather    Dementia  Paternal Grandmother    Lung disease Paternal Grandfather    Colon cancer Neg Hx    Esophageal cancer Neg Hx    Rectal cancer Neg Hx    Stomach cancer Neg Hx    Colon polyps Neg Hx     Social History:  reports that he has never smoked. He has never used smokeless tobacco. He reports previous alcohol use. He reports that he does not use drugs.  ROS: All other review of systems were reviewed and are negative except what is noted above in HPI  Physical Exam: BP 136/85   Pulse 97   Ht _0  (1.727 m)   Wt 222 lb (100.7 kg)   BMI 33.75 kg/m   Constitutional:  Alert and oriented, No acute distress. HEENT: [place] AT, moist mucus membranes.  Trachea midline, no  masses. Cardiovascular: No clubbing, cyanosis, or edema. Respiratory: Normal respiratory effort, no increased work of breathing. GI: Abdomen is soft, nontender, nondistended, no abdominal masses GU: No CVA tenderness.  Lymph: No cervical or inguinal lymphadenopathy. Skin: No rashes, bruises or suspicious lesions. Neurologic: Grossly intact, no focal deficits, moving all 4 extremities. Psychiatric: Normal mood and affect.  Laboratory Data: Lab Results  Component Value Date   WBC 3.8 03/29/2021   HGB 14.8 03/29/2021   HCT 44.8 03/29/2021   MCV 83.0 03/29/2021   PLT 251 03/29/2021    Lab Results  Component Value Date   CREATININE 1.95 (H) 03/29/2021    No results found for: PSA  Lab Results  Component Value Date   TESTOSTERONE 120 (L) 03/29/2021    Lab Results  Component Value Date   HGBA1C 7.2 (H) 09/06/2020    Urinalysis No results found for: COLORURINE, APPEARANCEUR, LABSPEC, PHURINE, GLUCOSEU, HGBUR, BILIRUBINUR, KETONESUR, PROTEINUR, UROBILINOGEN, NITRITE, LEUKOCYTESUR  Lab Results  Component Value Date   LABMICR 6.5 07/08/2019    Pertinent Imaging: KUb today: Images reviewed and discussed with the patient  No results found for this or any previous visit.  No results found for this or any previous visit. . No results found for this or any previous visit.  No results found for this or any previous visit.  No results found for this or any previous visit.  No results found for this or any previous visit.  No results found for this or any previous visit.  No results found for this or any previous visit.   Assessment & Plan:    1. Kidney stones -We discussed the management of kidney stones. These options include observation, ureteroscopy, shockwave lithotripsy (ESWL) and percutaneous nephrolithotomy (PCNL). We discussed which options are relevant to the patient's stone(s). We discussed the natural history of kidney stones as well as the complications of  untreated stones and the impact on quality of life without treatment as well as with each of the above listed treatments. We also discussed the efficacy of each treatment in its ability to clear the stone burden. With any of these management options I discussed the signs and symptoms of infection and the need for emergent treatment should these be experienced. For each option we discussed the ability of each procedure to clear the patient of their stone burden.   For observation I described the risks which include but are not limited to silent renal damage, life-threatening infection, need for emergent surgery, failure to pass stone and pain.   For ureteroscopy I described the risks which include bleeding, infection, damage to contiguous structures, positioning injury, ureteral stricture, ureteral avulsion, ureteral  injury, need for prolonged ureteral stent, inability to perform ureteroscopy, need for an interval procedure, inability to clear stone burden, stent discomfort/pain, heart attack, stroke, pulmonary embolus and the inherent risks with general anesthesia.   For shockwave lithotripsy I described the risks which include arrhythmia, kidney contusion, kidney hemorrhage, need for transfusion, pain, inability to adequately break up stone, inability to pass stone fragments, Steinstrasse, infection associated with obstructing stones, need for alternate surgical procedure, need for repeat shockwave lithotripsy, MI, CVA, PE and the inherent risks with anesthesia/conscious sedation.   For PCNL I described the risks including positioning injury, pneumothorax, hydrothorax, need for chest tube, inability to clear stone burden, renal laceration, arterial venous fistula or malformation, need for embolization of kidney, loss of kidney or renal function, need for repeat procedure, need for prolonged nephrostomy tube, ureteral avulsion, MI, CVA, PE and the inherent risks of general anesthesia.   - The patient would  like to proceed with Left ESWL - Urinalysis, Routine w reflex microscopic   No follow-ups on file.  Nicolette Bang, MD  [facility] [place]

## 2021-04-14 ENCOUNTER — Other Ambulatory Visit: Payer: Self-pay | Admitting: Family Medicine

## 2021-04-14 DIAGNOSIS — E291 Testicular hypofunction: Secondary | ICD-10-CM

## 2021-04-14 NOTE — Patient Instructions (Signed)
Julian Scott  04/14/2021     @PREFPERIOPPHARMACY @   Your procedure is scheduled on 04/19/2021.  Report to 06/20/2021 at 6:15 A.M.  Call this number if you have problems the morning of surgery:  [phone]   Remember:  Do not eat or drink after midnight.      Take these medicines the morning of surgery with A SIP OF WATER : Amlodipine, Apresoline and Metoprolol   Do not take any diabetic medications the day of the procedure.     Do not wear jewelry, make-up or nail polish.  Do not wear lotions, powders, or perfumes, or deodorant.  Do not shave 48 hours prior to surgery.  Men may shave face and neck.  Do not bring valuables to the hospital.  [facility] is not responsible for any belongings or valuables.  Contacts, dentures or bridgework may not be worn into surgery.  Leave your suitcase in the car.  After surgery it may be brought to your room.  For patients admitted to the hospital, discharge time will be determined by your treatment team.  Patients discharged the day of surgery will not be allowed to drive home.   Name and phone number of your driver:   Family Special instructions:  N/a  Please read over the following fact sheets that you were given. Care and Recovery After Surgery  [employer] (25th ed., pp. [phone]). [place], [place]: [employer] [place], Lelon Perla. Retrieved from https://www.clinicalkey.com/#!/content/book/3-s2.0-B9781455750177001264?[email or web address]">  Lithotripsy  Lithotripsy is a treatment that can help break up kidney stones that are too large to pass on their own. This is a nonsurgical procedure that crushes a kidney stone with shock waves. These shock waves pass through your body and focus on the kidney stone. They cause the kidney stone to break up into smaller pieces while it is still in the urinary tract. The smaller pieces of stone canpass more easily out of your body in the urine. Tell a health care provider about: Any allergies  you have. All medicines you are taking, including vitamins, herbs, eye drops, creams, and over-the-counter medicines. Any problems you or family members have had with anesthetic medicines. Any blood disorders you have. Any surgeries you have had. Any medical conditions you have. Whether you are pregnant or may be pregnant. What are the risks? Generally, this is a safe procedure. However, problems may occur, including: Infection. Bleeding from the kidney. Bruising of the kidney or skin. Scarring of the kidney, which can lead to: Increased blood pressure. Poor kidney function. Return (recurrence) of kidney stones. Damage to other structures or organs, such as the liver, colon, spleen, or pancreas. Blockage (obstruction) of the tube that carries urine from the kidney to the bladder (ureter). Failure of the kidney stone to break into pieces (fragments). What happens before the procedure? Staying hydrated Follow instructions from your health care provider about hydration, which may include: Up to 2 hours before the procedure - you may continue to drink clear liquids, such as water, clear fruit juice, black coffee, and plain tea. Eating and drinking restrictions Follow instructions from your health care provider about eating and drinking, which may include: 8 hours before the procedure - stop eating heavy meals or foods, such as meat, fried foods, or fatty foods. 6 hours before the procedure - stop eating light meals or foods, such as toast or cereal. 6 hours before the procedure - stop drinking milk or drinks that contain milk. 2 hours before the procedure - stop drinking clear liquids.  Medicines Ask your health care provider about: Changing or stopping your regular medicines. This is especially important if you are taking diabetes medicines or blood thinners. Taking medicines such as aspirin and ibuprofen. These medicines can thin your blood. Do not take these medicines unless your health  care provider tells you to take them. Taking over-the-counter medicines, vitamins, herbs, and supplements. Tests You may have tests, such as: Blood tests. Urine tests. Imaging tests, such as a CT scan. General instructions Plan to have someone take you home from the hospital or clinic. If you will be going home right after the procedure, plan to have someone with you for 24 hours. Ask your health care provider what steps will be taken to help prevent infection. These may include washing skin with a germ-killing soap. What happens during the procedure?  An IV will be inserted into one of your veins. You will be given one or more of the following: A medicine to help you relax (sedative). A medicine to make you fall asleep (general anesthetic). A water-filled cushion may be placed behind your kidney or on your abdomen. In some cases, you may be placed in a tub of lukewarm water. Your body will be positioned in a way that makes it easy to target the kidney stone. An X-ray or ultrasound exam will be done to locate your stone. Shock waves will be aimed at the stone. If you are awake, you may feel a tapping sensation as the shock waves pass through your body. A flexible tube with holes in it (stent) may be placed in the ureter. This will help keep urine flowing from the kidney if the fragments of the stone have been blocking the ureter. The procedure may vary among health care providers and hospitals. What happens after the procedure? You may have an X-ray to see whether the procedure was able to break up the kidney stone and how much of the stone has passed. If large stone fragments remain after treatment, you may need to have a second procedure at a later time. Your blood pressure, heart rate, breathing rate, and blood oxygen level will be monitored until you leave the hospital or clinic. You may be given antibiotics or pain medicine as needed. If a stent was placed in your ureter during  surgery, it may stay in place for a few weeks. You may need to strain your urine to collect pieces of the kidney stone for testing. You will need to drink plenty of water. If you were given a sedative during the procedure, it can affect you for several hours. Do not drive or operate machinery until your health care provider says that it is safe. Summary Lithotripsy is a treatment that can help break up kidney stones that are too large to pass on their own. Lithotripsy is a nonsurgical procedure that crushes a kidney stone with shock waves. Generally, this is a safe procedure. However, problems may occur, including damage to the kidney or other organs, infection, or obstruction of the tube that carries urine from the kidney to the bladder (ureter). You may have a stent placed in your ureter to help drain your urine. This stent may stay in place for a few weeks. After the procedure, you will need to drink plenty of water. You may be asked to strain your urine to collect pieces of the kidney stone for testing. This information is not intended to replace advice given to you by your health care provider. Make sure you discuss  any questions you have with your healthcare provider. Document Revised: 07/15/2019 Document Reviewed: 07/16/2019 Elsevier Patient Education  2022 [employer].

## 2021-04-15 ENCOUNTER — Encounter (HOSPITAL_COMMUNITY)
Admission: RE | Admit: 2021-04-15 | Discharge: 2021-04-15 | Disposition: A | Discharge status: Home / Self Care | Payer: Commercial Managed Care - PPO | Source: Ambulatory Visit | Attending: Urology | Admitting: Urology

## 2021-04-15 ENCOUNTER — Other Ambulatory Visit: Payer: Self-pay

## 2021-04-18 ENCOUNTER — Other Ambulatory Visit: Payer: Self-pay | Admitting: Family Medicine

## 2021-04-18 DIAGNOSIS — F5101 Primary insomnia: Secondary | ICD-10-CM

## 2021-04-19 ENCOUNTER — Ambulatory Visit (HOSPITAL_COMMUNITY)
Admission: RE | Admit: 2021-04-19 | Discharge: 2021-04-19 | Disposition: A | Discharge status: Home / Self Care | Payer: Commercial Managed Care - PPO | Source: Home / Self Care | Attending: Urology | Admitting: Urology

## 2021-04-19 ENCOUNTER — Encounter (HOSPITAL_COMMUNITY)
Admission: RE | Disposition: A | Discharge status: Home / Self Care | Payer: Self-pay | Source: Home / Self Care | Attending: Urology

## 2021-04-19 ENCOUNTER — Other Ambulatory Visit: Payer: Self-pay

## 2021-04-19 ENCOUNTER — Ambulatory Visit (HOSPITAL_COMMUNITY)
Admission: RE | Admit: 2021-04-19 | Discharge: 2021-04-19 | Disposition: A | Discharge status: Home / Self Care | Payer: Commercial Managed Care - PPO | Attending: Urology | Admitting: Urology

## 2021-04-19 DIAGNOSIS — N200 Calculus of kidney: Secondary | ICD-10-CM | POA: Diagnosis present

## 2021-04-19 DIAGNOSIS — Z79899 Other long term (current) drug therapy: Secondary | ICD-10-CM | POA: Insufficient documentation

## 2021-04-19 DIAGNOSIS — Z79890 Hormone replacement therapy (postmenopausal): Secondary | ICD-10-CM | POA: Diagnosis not present

## 2021-04-19 DIAGNOSIS — Z7984 Long term (current) use of oral hypoglycemic drugs: Secondary | ICD-10-CM | POA: Diagnosis not present

## 2021-04-19 HISTORY — PX: EXTRACORPOREAL SHOCK WAVE LITHOTRIPSY: SHX1557

## 2021-04-19 LAB — GLUCOSE, CAPILLARY: Glucose-Capillary: 113 mg/dL — ABNORMAL HIGH (ref 70–99)

## 2021-04-19 SURGERY — LITHOTRIPSY, ESWL
Anesthesia: LOCAL | Laterality: Left

## 2021-04-19 MED ORDER — DIAZEPAM 5 MG PO TABS
ORAL_TABLET | ORAL | Status: AC
  Filled 2021-04-19: qty 2

## 2021-04-19 MED ORDER — DIPHENHYDRAMINE HCL 25 MG PO CAPS
25.0000 mg | ORAL_CAPSULE | ORAL | Status: AC
Start: 2021-04-19 — End: 2021-04-19
  Administered 2021-04-19: 25 mg via ORAL
  Filled 2021-04-19: qty 1

## 2021-04-19 MED ORDER — DIAZEPAM 5 MG PO TABS
10.0000 mg | ORAL_TABLET | Freq: Once | ORAL | Status: AC
  Administered 2021-04-19: 10 mg via ORAL

## 2021-04-19 MED ORDER — SODIUM CHLORIDE 0.9 % IV SOLN: Freq: Once | INTRAVENOUS | Status: AC

## 2021-04-19 MED ORDER — OXYCODONE-ACETAMINOPHEN 5-325 MG PO TABS: 1.0000 | ORAL_TABLET | ORAL | 0 refills | Status: AC | PRN

## 2021-04-19 MED ORDER — ONDANSETRON HCL 4 MG PO TABS: 4.0000 mg | ORAL_TABLET | Freq: Every day | ORAL | 1 refills | Status: DC | PRN

## 2021-04-19 NOTE — Interval H&P Note (Signed)
History and Physical Interval Note:  04/19/2021 8:36 AM  Julian Scott  has presented today for surgery, with the diagnosis of left renal calculus.  The various methods of treatment have been discussed with the patient and family. After consideration of risks, benefits and other options for treatment, the patient has consented to  Procedure(s): EXTRACORPOREAL SHOCK WAVE LITHOTRIPSY (ESWL) (Left) as a surgical intervention.  The patient's history has been reviewed, patient examined, no change in status, stable for surgery.  I have reviewed the patient's chart and labs.  Questions were answered to the patient's satisfaction.     Wilkie Aye

## 2021-04-22 ENCOUNTER — Other Ambulatory Visit (INDEPENDENT_AMBULATORY_CARE_PROVIDER_SITE_OTHER): Payer: Self-pay

## 2021-04-22 ENCOUNTER — Encounter (HOSPITAL_COMMUNITY): Payer: Self-pay | Admitting: Urology

## 2021-04-22 NOTE — Telephone Encounter (Signed)
Was taking 2 [employer] in am. And not eating until luchtime.  But since he is out from procedure w/ Dr Ronne Binning, He is having some concerns. Should I make him a appointment with you?

## 2021-04-25 ENCOUNTER — Telehealth: Payer: Self-pay

## 2021-04-25 MED ORDER — GLIPIZIDE 10 MG PO TABS: 10.0000 mg | ORAL_TABLET | Freq: Two times a day (BID) | ORAL | 0 refills | Status: DC

## 2021-04-25 MED ORDER — RYBELSUS 3 MG PO TABS: 3.0000 mg | ORAL_TABLET | Freq: Every day | ORAL | 3 refills | Status: DC

## 2021-04-25 NOTE — Telephone Encounter (Signed)
Left Voice Mail:  Needing a call back about dates for being out of work. Start and End dates.  Please advise.  Call back:  [phone]  Thanks, Rosey Bath

## 2021-04-25 NOTE — Telephone Encounter (Signed)
Letter generated for patient. Pt will back tomorrow with fax number to send to employer.

## 2021-04-28 ENCOUNTER — Other Ambulatory Visit: Payer: Self-pay | Admitting: Family Medicine

## 2021-04-28 DIAGNOSIS — E1169 Type 2 diabetes mellitus with other specified complication: Secondary | ICD-10-CM

## 2021-04-28 DIAGNOSIS — I10 Essential (primary) hypertension: Secondary | ICD-10-CM

## 2021-05-03 ENCOUNTER — Other Ambulatory Visit: Payer: Self-pay | Admitting: Internal Medicine

## 2021-05-03 DIAGNOSIS — F5101 Primary insomnia: Secondary | ICD-10-CM

## 2021-05-05 ENCOUNTER — Other Ambulatory Visit: Payer: Self-pay

## 2021-05-05 ENCOUNTER — Ambulatory Visit (HOSPITAL_COMMUNITY)
Admission: RE | Admit: 2021-05-05 | Discharge: 2021-05-05 | Disposition: A | Discharge status: Home / Self Care | Payer: Commercial Managed Care - PPO | Source: Ambulatory Visit | Attending: Urology | Admitting: Urology

## 2021-05-05 DIAGNOSIS — N200 Calculus of kidney: Secondary | ICD-10-CM | POA: Diagnosis present

## 2021-05-10 ENCOUNTER — Other Ambulatory Visit (INDEPENDENT_AMBULATORY_CARE_PROVIDER_SITE_OTHER): Payer: Commercial Managed Care - PPO

## 2021-05-10 ENCOUNTER — Telehealth: Payer: Commercial Managed Care - PPO | Admitting: Urology

## 2021-05-10 ENCOUNTER — Other Ambulatory Visit: Payer: Self-pay

## 2021-05-10 DIAGNOSIS — F5101 Primary insomnia: Secondary | ICD-10-CM

## 2021-05-10 DIAGNOSIS — I10 Essential (primary) hypertension: Secondary | ICD-10-CM

## 2021-05-10 DIAGNOSIS — N200 Calculus of kidney: Secondary | ICD-10-CM

## 2021-05-10 MED ORDER — TESTOSTERONE CYPIONATE 200 MG/ML IM SOLN: 100.0000 mg | INTRAMUSCULAR | 2 refills | Status: AC

## 2021-05-10 MED ORDER — AMLODIPINE BESYLATE 10 MG PO TABS: 10.0000 mg | ORAL_TABLET | Freq: Every day | ORAL | 1 refills | Status: AC

## 2021-05-10 MED ORDER — ONDANSETRON HCL 4 MG PO TABS: 4.0000 mg | ORAL_TABLET | Freq: Every day | ORAL | 1 refills | Status: AC | PRN

## 2021-05-10 MED ORDER — HYDRALAZINE HCL 25 MG PO TABS: 25.0000 mg | ORAL_TABLET | Freq: Three times a day (TID) | ORAL | 3 refills | Status: AC

## 2021-05-10 MED ORDER — RYBELSUS 3 MG PO TABS: 3.0000 mg | ORAL_TABLET | Freq: Every day | ORAL | 3 refills | Status: AC

## 2021-05-10 MED ORDER — OMEGA-3 FATTY ACIDS 1000 MG PO CAPS: 2.0000 g | ORAL_CAPSULE | ORAL | 3 refills | Status: AC

## 2021-05-10 MED ORDER — TRAZODONE HCL 50 MG PO TABS: 50.0000 mg | ORAL_TABLET | Freq: Every evening | ORAL | 2 refills | Status: AC | PRN

## 2021-05-10 MED ORDER — TEMAZEPAM 30 MG PO CAPS: 30.0000 mg | ORAL_CAPSULE | Freq: Every evening | ORAL | 2 refills | Status: AC | PRN

## 2021-05-10 MED ORDER — LOSARTAN POTASSIUM-HCTZ 100-25 MG PO TABS: 1.0000 | ORAL_TABLET | Freq: Every day | ORAL | 2 refills | Status: AC

## 2021-05-10 MED ORDER — GLIPIZIDE 10 MG PO TABS: 10.0000 mg | ORAL_TABLET | Freq: Two times a day (BID) | ORAL | 2 refills | Status: DC

## 2021-05-10 MED ORDER — TAMSULOSIN HCL 0.4 MG PO CAPS: 0.4000 mg | ORAL_CAPSULE | Freq: Every day | ORAL | 3 refills | Status: AC

## 2021-05-10 MED ORDER — METOPROLOL SUCCINATE ER 25 MG PO TB24: 25.0000 mg | ORAL_TABLET | Freq: Every day | ORAL | 2 refills | Status: AC

## 2021-05-17 ENCOUNTER — Other Ambulatory Visit: Payer: Self-pay | Admitting: Family Medicine

## 2021-05-19 ENCOUNTER — Encounter (INDEPENDENT_AMBULATORY_CARE_PROVIDER_SITE_OTHER): Payer: Self-pay

## 2021-06-13 ENCOUNTER — Ambulatory Visit (INDEPENDENT_AMBULATORY_CARE_PROVIDER_SITE_OTHER): Payer: Commercial Managed Care - PPO | Admitting: Internal Medicine

## 2023-01-09 IMAGING — DX DG ABDOMEN 1V
2 series · 2 of 2 positions shown · non-contrast
Comparison: CT abdomen and pelvis March 27, 2021

CLINICAL DATA: Nephrolithiasis

EXAM:
ABDOMEN - 1 VIEW

[abdomen kub (1 of 2)]
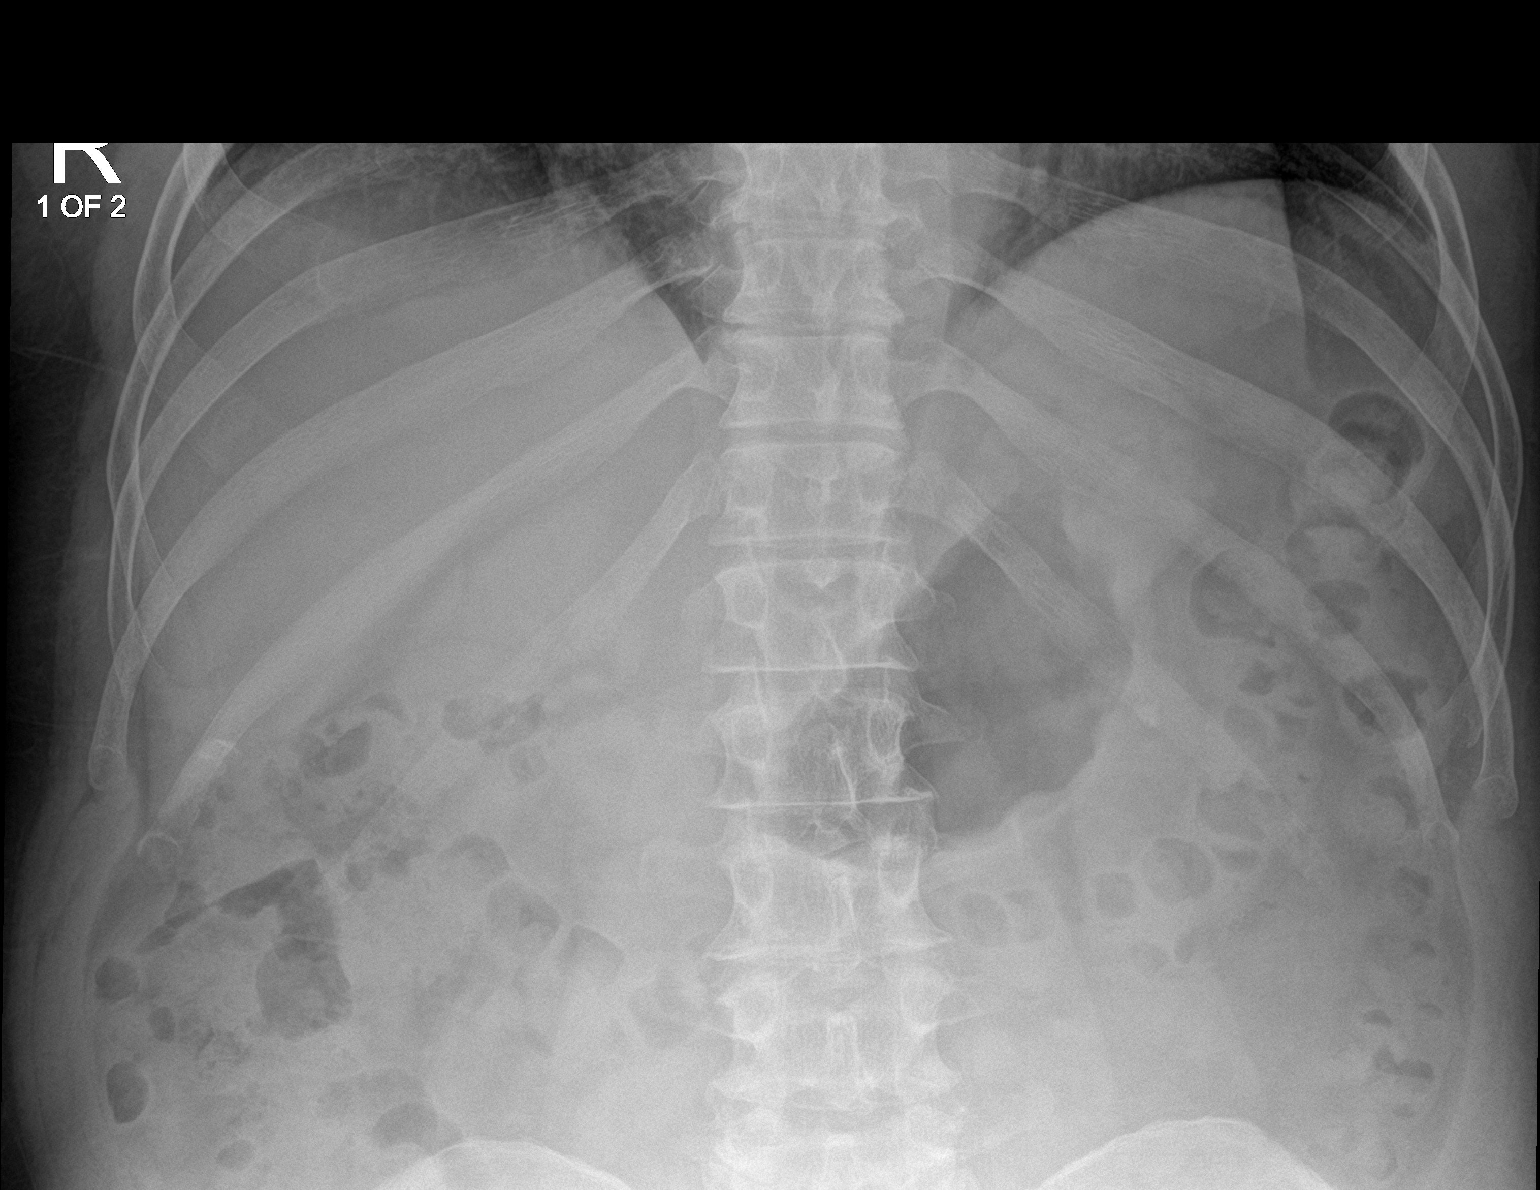

[abdomen kub (2 of 2)]
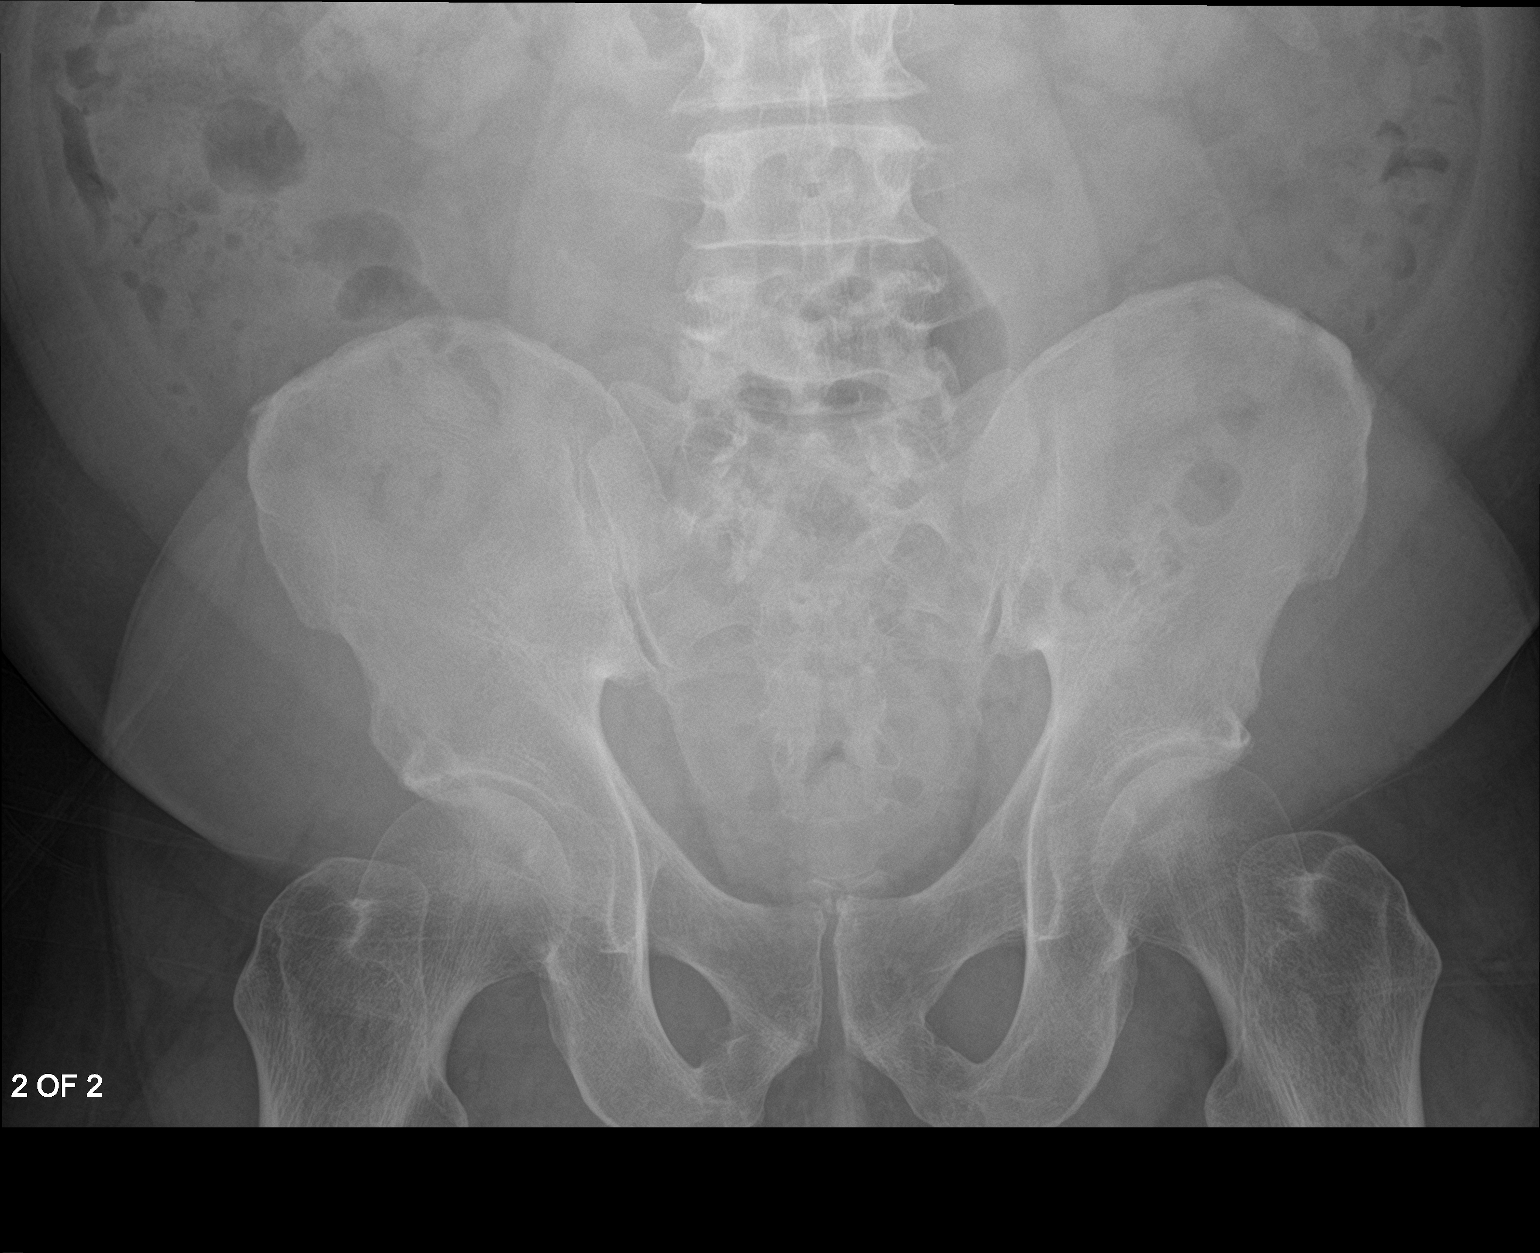

[2 of 2 positions shown; findings below may reference images not displayed]

FINDINGS: There is a calculus in the mid left kidney measuring 8 x 6 mm. No
other abnormal calcifications evident. There is moderate stool in
colon. No evident bowel dilatation or air-fluid level to suggest
bowel obstruction. No free air. Lung bases clear.
IMPRESSION: By 6 mm calculus mid left kidney. Moderate stool in colon. No bowel
obstruction or free air.

## 2023-01-31 IMAGING — DX DG ABDOMEN 1V
2 series · 2 of 2 positions shown · non-contrast
Comparison: 04/19/2021

CLINICAL DATA: Status post lithotripsy, nephrolithiasis

EXAM:
ABDOMEN - 1 VIEW

[abdomen kub (1 of 2)]
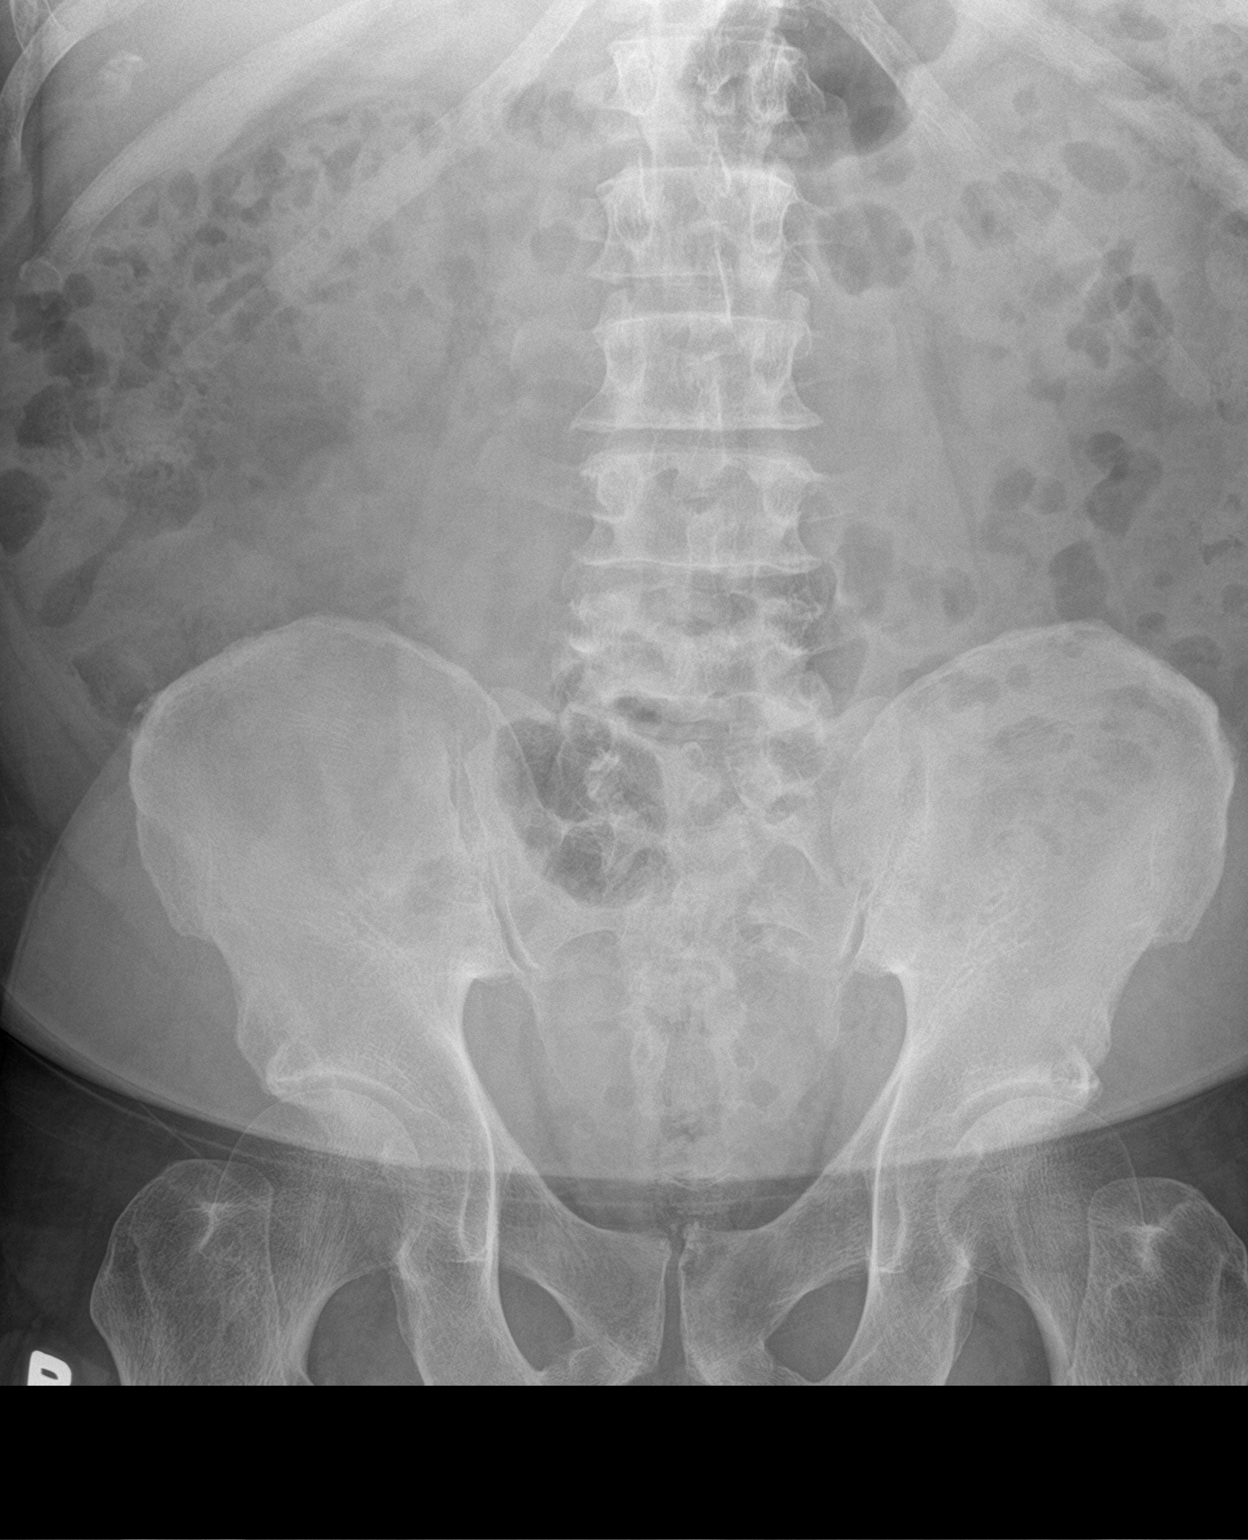

[abdomen kub (2 of 2)]
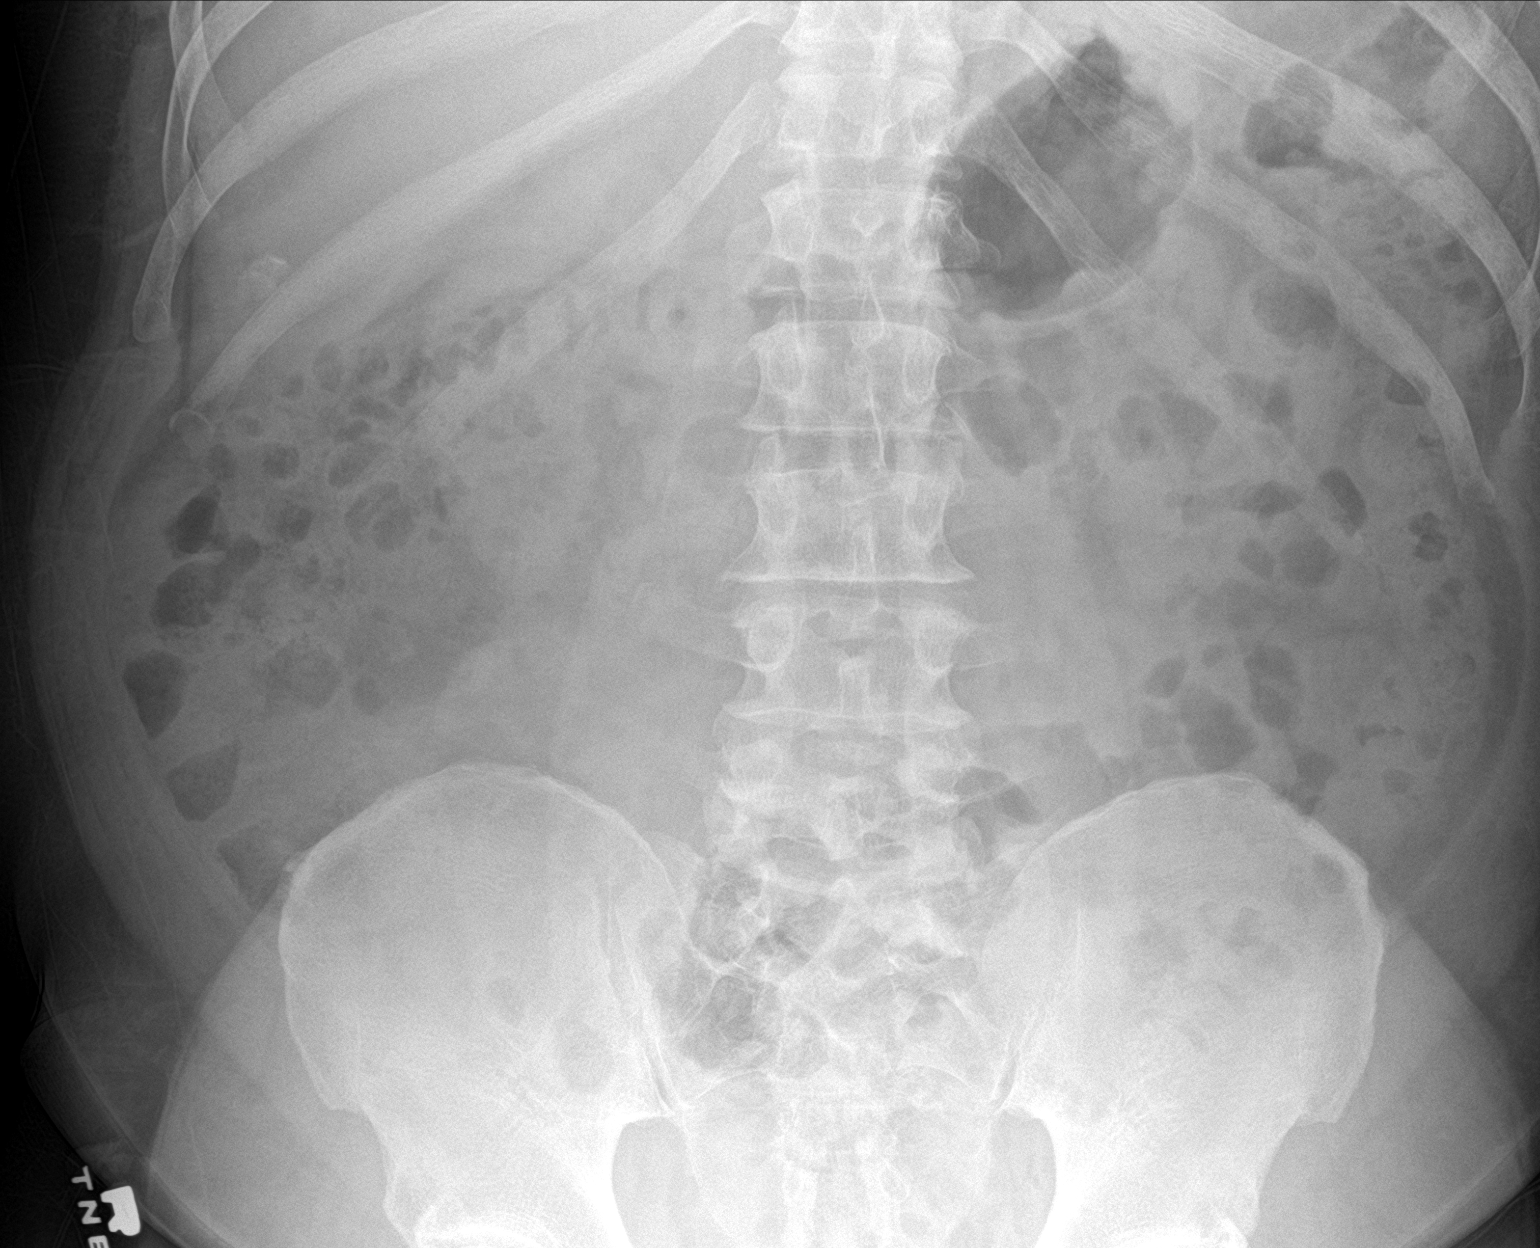

[2 of 2 positions shown; findings below may reference images not displayed]

FINDINGS: Previous identified calculus overlying the lower pole of the left
kidney is no longer visualized. No residual nephro or urolithiasis
identified. Normal abdominal gas pattern. No osseous abnormalities.
IMPRESSION: Resolved left nephrolithiasis. No residual nephro or urolithiasis
identified.

## 2024-04-26 LAB — COLOGUARD: COLOGUARD: NEGATIVE
